# Patient Record
Sex: Male | Born: 1980 | Race: White | Hispanic: No | Marital: Single | Smoking: Current some day smoker
Health system: Southern US, Community
[De-identification: ages and names within clinical notes are randomized; demographics above are authoritative.]

## PROBLEM LIST (undated history)

## (undated) DIAGNOSIS — G35 Multiple sclerosis: Secondary | ICD-10-CM

## (undated) DIAGNOSIS — F419 Anxiety disorder, unspecified: Secondary | ICD-10-CM

## (undated) DIAGNOSIS — L0201 Cutaneous abscess of face: Secondary | ICD-10-CM

## (undated) HISTORY — PX: OTHER SURGICAL HISTORY: SHX169

## (undated) HISTORY — DX: Anxiety disorder, unspecified: F41.9

---

## 2005-03-08 ENCOUNTER — Emergency Department (HOSPITAL_COMMUNITY): Admission: EM | Admit: 2005-03-08 | Discharge: 2005-03-08 | Payer: Self-pay | Admitting: Emergency Medicine

## 2005-09-07 ENCOUNTER — Emergency Department (HOSPITAL_COMMUNITY): Admission: EM | Admit: 2005-09-07 | Discharge: 2005-09-08 | Payer: Self-pay | Admitting: *Deleted

## 2007-01-17 ENCOUNTER — Ambulatory Visit (HOSPITAL_COMMUNITY): Admission: RE | Admit: 2007-01-17 | Discharge: 2007-01-17 | Payer: Self-pay | Admitting: Internal Medicine

## 2009-11-12 ENCOUNTER — Emergency Department (HOSPITAL_COMMUNITY): Admission: EM | Admit: 2009-11-12 | Discharge: 2009-11-12 | Payer: Self-pay | Admitting: Family Medicine

## 2010-01-04 ENCOUNTER — Emergency Department (HOSPITAL_COMMUNITY): Admission: EM | Admit: 2010-01-04 | Discharge: 2010-01-04 | Payer: Self-pay | Admitting: Emergency Medicine

## 2010-01-30 ENCOUNTER — Emergency Department (HOSPITAL_COMMUNITY)
Admission: EM | Admit: 2010-01-30 | Discharge: 2010-01-30 | Payer: Self-pay | Source: Home / Self Care | Admitting: Emergency Medicine

## 2010-01-30 ENCOUNTER — Emergency Department (HOSPITAL_COMMUNITY): Admission: EM | Admit: 2010-01-30 | Discharge: 2010-01-30 | Payer: Self-pay | Admitting: Emergency Medicine

## 2010-06-11 ENCOUNTER — Emergency Department (HOSPITAL_COMMUNITY): Admission: EM | Admit: 2010-06-11 | Discharge: 2010-06-11 | Payer: Self-pay | Admitting: Emergency Medicine

## 2010-06-13 ENCOUNTER — Emergency Department (HOSPITAL_COMMUNITY): Admission: EM | Admit: 2010-06-13 | Discharge: 2010-06-14 | Payer: Self-pay | Admitting: Emergency Medicine

## 2010-09-20 ENCOUNTER — Emergency Department (HOSPITAL_COMMUNITY)
Admission: EM | Admit: 2010-09-20 | Discharge: 2010-09-20 | Payer: Self-pay | Source: Home / Self Care | Admitting: Emergency Medicine

## 2010-12-04 LAB — RAPID URINE DRUG SCREEN, HOSP PERFORMED
Amphetamines: NOT DETECTED
Barbiturates: NOT DETECTED
Opiates: NOT DETECTED
Tetrahydrocannabinol: NOT DETECTED

## 2010-12-04 LAB — DIFFERENTIAL
Eosinophils Absolute: 0.1 10*3/uL (ref 0.0–0.7)
Lymphocytes Relative: 13 % (ref 12–46)
Lymphs Abs: 1.2 10*3/uL (ref 0.7–4.0)
Monocytes Relative: 9 % (ref 3–12)
Neutro Abs: 7.1 10*3/uL (ref 1.7–7.7)
Neutrophils Relative %: 77 % (ref 43–77)

## 2010-12-04 LAB — URINALYSIS, ROUTINE W REFLEX MICROSCOPIC
Nitrite: NEGATIVE
Specific Gravity, Urine: 1.02 (ref 1.005–1.030)
Urobilinogen, UA: 0.2 mg/dL (ref 0.0–1.0)
pH: 6.5 (ref 5.0–8.0)

## 2010-12-04 LAB — ACETAMINOPHEN LEVEL: Acetaminophen (Tylenol), Serum: <10 ug/mL — ABNORMAL LOW (ref 10–30)

## 2010-12-04 LAB — BASIC METABOLIC PANEL
BUN: 13 mg/dL (ref 6–23)
Calcium: 9.2 mg/dL (ref 8.4–10.5)
Creatinine, Ser: 0.82 mg/dL (ref 0.4–1.5)
GFR calc Af Amer: >60 mL/min (ref >60)
GFR calc non Af Amer: >60 mL/min (ref >60)

## 2010-12-04 LAB — CBC
MCV: 88.4 fL (ref 78.0–100.0)
Platelets: 191 10*3/uL (ref 150–400)
RBC: 4.56 MIL/uL (ref 4.22–5.81)
WBC: 9.2 10*3/uL (ref 4.0–10.5)

## 2010-12-04 LAB — ETHANOL: Alcohol, Ethyl (B): <5 mg/dL (ref 0–10)

## 2010-12-04 LAB — SALICYLATE LEVEL: Salicylate Lvl: <4 mg/dL (ref 2.8–20.0)

## 2010-12-05 LAB — URINALYSIS, ROUTINE W REFLEX MICROSCOPIC
Ketones, ur: 15 mg/dL — AB
Leukocytes, UA: NEGATIVE
Nitrite: NEGATIVE
pH: 6.5 (ref 5.0–8.0)

## 2010-12-05 LAB — DIFFERENTIAL
Lymphs Abs: 2.6 10*3/uL (ref 0.7–4.0)
Monocytes Relative: 17 % — ABNORMAL HIGH (ref 3–12)
Neutro Abs: 3.4 10*3/uL (ref 1.7–7.7)
Neutrophils Relative %: 45 % (ref 43–77)

## 2010-12-05 LAB — CBC
HCT: 45.7 % (ref 39.0–52.0)
Hemoglobin: 15.7 g/dL (ref 13.0–17.0)
MCHC: 34.4 g/dL (ref 30.0–36.0)
Platelets: 180 10*3/uL (ref 150–400)
RDW: 13.9 % (ref 11.5–15.5)

## 2010-12-05 LAB — URINE MICROSCOPIC-ADD ON

## 2010-12-05 LAB — COMPREHENSIVE METABOLIC PANEL
BUN: 10 mg/dL (ref 6–23)
Calcium: 9.9 mg/dL (ref 8.4–10.5)
Glucose, Bld: 79 mg/dL (ref 70–99)
Sodium: 138 mEq/L (ref 135–145)
Total Protein: 8.3 g/dL (ref 6.0–8.3)

## 2011-06-25 ENCOUNTER — Emergency Department (HOSPITAL_COMMUNITY): Payer: Self-pay

## 2011-06-25 ENCOUNTER — Emergency Department (HOSPITAL_COMMUNITY)
Admission: EM | Admit: 2011-06-25 | Discharge: 2011-06-25 | Disposition: A | Discharge status: Home / Self Care | Payer: Self-pay | Attending: Emergency Medicine | Admitting: Emergency Medicine

## 2011-06-25 DIAGNOSIS — J069 Acute upper respiratory infection, unspecified: Secondary | ICD-10-CM | POA: Insufficient documentation

## 2011-06-25 DIAGNOSIS — R569 Unspecified convulsions: Secondary | ICD-10-CM | POA: Insufficient documentation

## 2011-06-25 DIAGNOSIS — J3489 Other specified disorders of nose and nasal sinuses: Secondary | ICD-10-CM | POA: Insufficient documentation

## 2011-06-25 DIAGNOSIS — G35 Multiple sclerosis: Secondary | ICD-10-CM | POA: Insufficient documentation

## 2011-06-25 DIAGNOSIS — R05 Cough: Secondary | ICD-10-CM | POA: Insufficient documentation

## 2011-06-25 DIAGNOSIS — R197 Diarrhea, unspecified: Secondary | ICD-10-CM | POA: Insufficient documentation

## 2011-06-25 DIAGNOSIS — R059 Cough, unspecified: Secondary | ICD-10-CM | POA: Insufficient documentation

## 2011-06-25 LAB — POCT I-STAT, CHEM 8
BUN: 7 mg/dL (ref 6–23)
Chloride: 102 mEq/L (ref 96–112)
Creatinine, Ser: 1 mg/dL (ref 0.50–1.35)
Glucose, Bld: 94 mg/dL (ref 70–99)
HCT: 49 % (ref 39.0–52.0)
Potassium: 3.8 mEq/L (ref 3.5–5.1)

## 2013-01-28 ENCOUNTER — Emergency Department (HOSPITAL_BASED_OUTPATIENT_CLINIC_OR_DEPARTMENT_OTHER)
Admission: EM | Admit: 2013-01-28 | Discharge: 2013-01-28 | Disposition: A | Discharge status: Home / Self Care | Payer: BC Managed Care – PPO | Attending: Emergency Medicine | Admitting: Emergency Medicine

## 2013-01-28 ENCOUNTER — Encounter (HOSPITAL_BASED_OUTPATIENT_CLINIC_OR_DEPARTMENT_OTHER): Payer: Self-pay | Admitting: *Deleted

## 2013-01-28 DIAGNOSIS — F17200 Nicotine dependence, unspecified, uncomplicated: Secondary | ICD-10-CM | POA: Insufficient documentation

## 2013-01-28 DIAGNOSIS — Z880 Allergy status to penicillin: Secondary | ICD-10-CM | POA: Insufficient documentation

## 2013-01-28 DIAGNOSIS — K029 Dental caries, unspecified: Secondary | ICD-10-CM

## 2013-01-28 DIAGNOSIS — Z8669 Personal history of other diseases of the nervous system and sense organs: Secondary | ICD-10-CM | POA: Insufficient documentation

## 2013-01-28 HISTORY — DX: Multiple sclerosis: G35

## 2013-01-28 MED ORDER — TRAMADOL HCL 50 MG PO TABS: 50.0000 mg | ORAL_TABLET | Freq: Four times a day (QID) | ORAL | Status: DC | PRN

## 2013-01-28 MED ORDER — CLINDAMYCIN HCL 300 MG PO CAPS: 300.0000 mg | ORAL_CAPSULE | Freq: Four times a day (QID) | ORAL | Status: DC

## 2013-01-28 NOTE — ED Notes (Signed)
PT C/O TOOTHACHE X 1 DAY

## 2013-01-28 NOTE — ED Provider Notes (Signed)
History     CSN: [id number]  Arrival date & time 01/28/13  1901   First MD Initiated Contact with Patient 01/28/13 1922      Chief Complaint  Patient presents with  . Dental Pain    (Consider location/radiation/quality/duration/timing/severity/associated sxs/prior treatment) HPI Comments: Patient is a one-month history of pain to his left upper molar. He says over last 2 days the pain became worse and more frequent. He describes a constant throbbing pain intensifies at times. He denies he nausea vomiting. He denies any facial swelling. He denies he fevers or chills. He currently does not have a dentist.  Patient is a 32 y.o. male presenting with tooth pain.  Dental PainPrimary symptoms do not include headaches or fever.  Additional symptoms do not include: facial swelling.    Past Medical History  Diagnosis Date  . MS (multiple sclerosis)     History reviewed. No pertinent past surgical history.  History reviewed. No pertinent family history.  History  Substance Use Topics  . Smoking status: Current Some Day Smoker  . Smokeless tobacco: Not on file  . Alcohol Use: No      Review of Systems  Constitutional: Negative for fever.  HENT: Positive for dental problem. Negative for facial swelling.   Gastrointestinal: Negative for nausea and vomiting.  Skin: Negative for rash.  Neurological: Negative for light-headedness and headaches.    Allergies  Penicillins  Home Medications   Current Outpatient Rx  Name  Route  Sig  Dispense  Refill  . clindamycin (CLEOCIN) 300 MG capsule   Oral   Take 1 capsule (300 mg total) by mouth 4 (four) times daily. X 7 days   28 capsule   0   . traMADol (ULTRAM) 50 MG tablet   Oral   Take 1 tablet (50 mg total) by mouth every 6 (six) hours as needed for pain.   15 tablet   0     BP 135/80  Pulse 67  Temp(Src) 98 F (36.7 C) (Oral)  Resp 16  Ht 6\' 1"  (1.854 m)  Wt 150 lb (68.04 kg)  BMI 19.79 kg/m2  SpO2  100%  Physical Exam  Constitutional: He is oriented to person, place, and time. He appears well-developed and well-nourished.  HENT:  Mouth/Throat: Oropharynx is clear and moist.  Positive tenderness to the left upper back molar. There is no swelling or induration. No trismus. Uvula is midline.  Cardiovascular: Normal rate.   Pulmonary/Chest: Effort normal.  Neurological: He is alert and oriented to person, place, and time.    ED Course  Procedures (including critical care time)  Labs Reviewed - No data to display No results found.   1. Dental caries       MDM  Patient was started on antibiotics and pain medicine. He was given referral to followup with the dental clinic.        Rolan Bucco, MD 01/28/13 [phone]

## 2013-01-28 NOTE — Discharge Instructions (Signed)

## 2013-10-07 ENCOUNTER — Emergency Department (HOSPITAL_COMMUNITY)
Admission: EM | Admit: 2013-10-07 | Discharge: 2013-10-07 | Disposition: A | Discharge status: Home / Self Care | Payer: BC Managed Care – PPO | Attending: Emergency Medicine | Admitting: Emergency Medicine

## 2013-10-07 ENCOUNTER — Encounter (HOSPITAL_COMMUNITY): Payer: Self-pay | Admitting: Emergency Medicine

## 2013-10-07 DIAGNOSIS — Y939 Activity, unspecified: Secondary | ICD-10-CM | POA: Insufficient documentation

## 2013-10-07 DIAGNOSIS — F17200 Nicotine dependence, unspecified, uncomplicated: Secondary | ICD-10-CM | POA: Insufficient documentation

## 2013-10-07 DIAGNOSIS — T424X4A Poisoning by benzodiazepines, undetermined, initial encounter: Secondary | ICD-10-CM | POA: Insufficient documentation

## 2013-10-07 DIAGNOSIS — Z880 Allergy status to penicillin: Secondary | ICD-10-CM | POA: Insufficient documentation

## 2013-10-07 DIAGNOSIS — Y929 Unspecified place or not applicable: Secondary | ICD-10-CM | POA: Insufficient documentation

## 2013-10-07 DIAGNOSIS — T424X1A Poisoning by benzodiazepines, accidental (unintentional), initial encounter: Secondary | ICD-10-CM | POA: Insufficient documentation

## 2013-10-07 DIAGNOSIS — Z8669 Personal history of other diseases of the nervous system and sense organs: Secondary | ICD-10-CM | POA: Insufficient documentation

## 2013-10-07 LAB — BASIC METABOLIC PANEL
BUN: 10 mg/dL (ref 6–23)
CALCIUM: 9.7 mg/dL (ref 8.4–10.5)
CHLORIDE: 104 meq/L (ref 96–112)
CO2: 26 meq/L (ref 19–32)
CREATININE: 0.93 mg/dL (ref 0.50–1.35)
GFR calc non Af Amer: >90 mL/min (ref >90)
Glucose, Bld: 80 mg/dL (ref 70–99)
Potassium: 3.8 mEq/L (ref 3.7–5.3)
SODIUM: 143 meq/L (ref 137–147)

## 2013-10-07 LAB — RAPID URINE DRUG SCREEN, HOSP PERFORMED
AMPHETAMINES: NOT DETECTED
Barbiturates: NOT DETECTED
Benzodiazepines: NOT DETECTED
COCAINE: NOT DETECTED
OPIATES: NOT DETECTED
TETRAHYDROCANNABINOL: NOT DETECTED

## 2013-10-07 LAB — ACETAMINOPHEN LEVEL

## 2013-10-07 LAB — SALICYLATE LEVEL: Salicylate Lvl: <2 mg/dL — ABNORMAL LOW (ref 2.8–20.0)

## 2013-10-07 LAB — CBC
HCT: 42.1 % (ref 39.0–52.0)
Hemoglobin: 14.4 g/dL (ref 13.0–17.0)
MCH: 30.6 pg (ref 26.0–34.0)
MCHC: 34.2 g/dL (ref 30.0–36.0)
MCV: 89.4 fL (ref 78.0–100.0)
PLATELETS: 226 10*3/uL (ref 150–400)
RBC: 4.71 MIL/uL (ref 4.22–5.81)
RDW: 13.7 % (ref 11.5–15.5)
WBC: 7.2 10*3/uL (ref 4.0–10.5)

## 2013-10-07 LAB — ETHANOL: Alcohol, Ethyl (B): <11 mg/dL (ref 0–11)

## 2013-10-07 MED ORDER — AMMONIA AROMATIC IN INHA
RESPIRATORY_TRACT | Status: AC
  Filled 2013-10-07: qty 10

## 2013-10-07 NOTE — ED Notes (Signed)
Pt states he took 4 valium for pain relief. Denies SI. Pt girlfriend states pt took 10-2mg  clonazepam-states she does not think patient was trying to harm himself. Girlfriend states patient "started acting weird around 2pm".  Pt sleepy and confused in triage.

## 2013-10-07 NOTE — ED Provider Notes (Signed)
CSN: [id number]     Arrival date & time 10/07/13  1600 History  This chart was scribed for Ward GivensIva L Anique Beckley, MD by Ardelia Memsylan Malpass, ED Scribe. This patient was seen in room [facility]14/[facility]14 and the patient's care was started at 5:00 PM.    Chief Complaint  Patient presents with  . Drug Overdose    The history is provided by a friend. The history is limited by the condition of the patient. No language interpreter was used.   LEVEL 5 CAVEAT (Pt doesn't awaken to verbal stimuli or sternal rub)  HPI Comments: Jack Hopkins is a 33 y.o. male who presents to the Emergency Department complaining of a drug overdose that occurred earlier today. Pt had admitted to taking 4 valium today for pain relief. Pt's girlfriend reported that pt took 10-2mg  clonazepam. Currently, pt doesn't awaken on verbal stimuli or sternal rub, but he is maintaining pulse ox at 99%. With hand over face testing, he hits his face  Upon recheck, additional history was gathered from pt's girlfriend who is now here. Girlfriend states that pt was noticed to be "sounding drunk" earlier today about 2:30 pm after being normal about 1 pm, and that he admitted to taking 10 of 2 mg Clonazepam. Girlfriend states that pt began wobbling later in the day, and continued "sounding drunk" and slurring his speech that was getting worse and he had her go to the bank at which point he got out of the car and he was having difficulty walking. She states he was falling asleep in the car, prompting her to bring pt to the ED. Girlfriend states that she does not believe pt had any suicidal intent today and she states that he has no history of SI. She states that pt has a history of recreational drug use, and she believes he just wanted to get high today. She states that pt used to use bath salts, but is not aware of any other drugs he is using now. Relative states that pt works full-time at [employer], and that he often drinks heavily on his days off. He is trying to quit  drinking and had not drank for a week until last night. Pt is a current some day smoker. 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   PCP none  Past Medical History  Diagnosis Date  . MS (multiple sclerosis)    History reviewed. No pertinent past surgical history. No family history on file. History  Substance Use Topics  . Smoking status: Current Some Day Smoker  . Smokeless tobacco: Not on file  . Alcohol Use: No  employed    Review of Systems  Unable to perform ROS: Mental status change   Allergies  Penicillins  Home Medications   Current Outpatient Rx  Name  Route  Sig  Dispense  Refill  . gabapentin (NEURONTIN) 800 MG tablet   Oral   Take 1 tablet by mouth 4 (four) times daily.          Triage Vitals: BP 127/66  Pulse 53  Temp(Src) 98.2 F (36.8 C) (Oral)  Resp 16  Ht 6\' 1"  (1.854 m)  Wt 150 lb (68.04 kg)  BMI 19.79 kg/m2  SpO2 96%  Vital signs normal except bradycardia   Physical Exam  Nursing note and vitals reviewed. Constitutional: He appears well-developed and well-nourished.  Non-toxic appearance. He does not appear ill. No distress.  Pt doesn't awaken on verbal  stimuli or sternal rub, but he is maintaining pulse ox at 99%. With hand over face testing, he hits his face.  HENT:  Head: Normocephalic and atraumatic.  Right Ear: External ear normal.  Left Ear: External ear normal.  Nose: Nose normal. No mucosal edema or rhinorrhea.  Mouth/Throat: Oropharynx is clear and moist and mucous membranes are normal. No dental abscesses or uvula swelling.  Eyes: Conjunctivae and EOM are normal. Pupils are equal, round, and reactive to light.  Neck: Normal range of motion and full passive range of motion without pain. Neck supple.  Cardiovascular: Normal rate, regular rhythm and normal heart sounds.  Exam reveals no gallop and no friction rub.   No murmur heard. Pulmonary/Chest: Effort  normal and breath sounds normal. No respiratory distress. He has no wheezes. He has no rhonchi. He has no rales. He exhibits no crepitus.  Abdominal: Soft. Normal appearance and bowel sounds are normal. He exhibits no distension. There is no rebound and no guarding.  Musculoskeletal: Normal range of motion. He exhibits no edema.  Neurological: He has normal strength. No cranial nerve deficit.  Skin: Skin is warm, dry and intact. No rash noted. No erythema. No pallor.  Psychiatric: His speech is normal. His mood appears not anxious.    ED Course  Procedures (including critical care time)  Medications  ammonia inhalant (not administered)   DIAGNOSTIC STUDIES: Oxygen Saturation is 96% on RA, normal by my interpretation.    COORDINATION OF CARE: 5:05 PM- Pt is un-arousable. Will obtain an EKG. Will recheck at a later time.  Poison control recommended observation for at least 6 hours. They recommend supportive care.  6:15 PM- Recheck and gathered additional history from pt's girlfriend.   7:11 PM- Upon another recheck, He is lying on his side and easily awakened, he still has some slurred speech. Pt states that he took the clonazepam today because he wanted to get sedated enough to sleep today, because he could not sleep last night. He denies having any intent of suicide or self harm.   8:32 PM- Upon another recheck, he knows the month and year, and said it is Thursday, instead of Wednesday. He knows he is in the hospital, but not that he is in [place]. He now states that he took one Clonazepam at first, and then kept re-dosing. He is not sure why he did this- but he states he had no intent of harming himself. He does indicate that he has a "full workload"- between work and school. He again denies SI or HI.  Pt ambulated by Nursing staff and he walked without assistance with HR 70 and pulse ox 95 %  Review of prior vital signs shows 1 prior ED visit in May 2014. That time his heart rate  was 67.  Labs Review Results for orders placed during the hospital encounter of 10/07/13  CBC      Result Value Range   WBC 7.2  4.0 - 10.5 K/uL   RBC 4.71  4.22 - 5.81 MIL/uL   Hemoglobin 14.4  13.0 - 17.0 g/dL   HCT 32.9  51.8 - 84.1 %   MCV 89.4  78.0 - 100.0 fL   MCH 30.6  26.0 - 34.0 pg   MCHC 34.2  30.0 - 36.0 g/dL   RDW 66.0  63.0 - 16.0 %   Platelets 226  150 - 400 K/uL  BASIC METABOLIC PANEL      Result Value Range   Sodium 143  137 -  147 mEq/L   Potassium 3.8  3.7 - 5.3 mEq/L   Chloride 104  96 - 112 mEq/L   CO2 26  19 - 32 mEq/L   Glucose, Bld 80  70 - 99 mg/dL   BUN 10  6 - 23 mg/dL   Creatinine, Ser 1.61  0.50 - 1.35 mg/dL   Calcium 9.7  8.4 - 09.6 mg/dL   GFR calc non Af Amer >90  >90 mL/min   GFR calc Af Amer >90  >90 mL/min  URINE RAPID DRUG SCREEN (HOSP PERFORMED)      Result Value Range   Opiates NONE DETECTED  NONE DETECTED   Cocaine NONE DETECTED  NONE DETECTED   Benzodiazepines NONE DETECTED  NONE DETECTED   Amphetamines NONE DETECTED  NONE DETECTED   Tetrahydrocannabinol NONE DETECTED  NONE DETECTED   Barbiturates NONE DETECTED  NONE DETECTED  SALICYLATE LEVEL      Result Value Range   Salicylate Lvl <2.0 (*) 2.8 - 20.0 mg/dL  ACETAMINOPHEN LEVEL      Result Value Range   Acetaminophen (Tylenol), Serum <15.0  10 - 30 ug/mL  ETHANOL      Result Value Range   Alcohol, Ethyl (B) <11  0 - 11 mg/dL   Laboratory interpretation all normal    Imaging Review No results found.  EKG Interpretation    Date/Time:  Wednesday October 07 2013 16:14:41 EST Ventricular Rate:  58 PR Interval:  158 QRS Duration: 98 QT Interval:  400 QTC Calculation: 392 R Axis:   79 Text Interpretation:  Sinus bradycardia Moderate voltage criteria for LVH, may be normal variant No previous ECGs available Confirmed by Insiya Oshea  MD-I, Ramiz Turpin ([id number]) on 10/07/2013 5:25:25 PM            MDM  patient presented with benzodiazepine overdose which appeared to be non-suicidal in  origin. He has a history of abusing drugs and used poor judgment and taking multiple doses of the pills tonight. He does not qualify for psychiatric admission.    1. Benzodiazepine overdose    Plan discharge   Devoria Albe, MD, FACEP   I personally performed the services described in this documentation, which was scribed in my presence. The recorded information has been reviewed and considered.  Devoria Albe, MD, Armando Gang    Ward Givens, MD 10/07/13 2200

## 2013-10-07 NOTE — ED Notes (Signed)
Phoned poison control. Recommendations include to watch for CNS, Resp depression, Bradycardia, hypotension. Treatment is supportive. Watch for a minimum of 6 hours. Recommend getting acetaminophen and salicylate levels also.

## 2013-10-07 NOTE — ED Notes (Signed)
Pt is now only responding to painful stimuli. Airway remains patent. O2 sats of 100%. EDP aware. To attempt to stimulate patient with ammonia.

## 2013-10-07 NOTE — ED Notes (Signed)
Pt is drowsy but responds appropriately to verbal stimuli at this time. VS remain stable.

## 2013-10-07 NOTE — Discharge Instructions (Signed)
You should not take multiple doses of medication at one time. This is extremely dangerous. Consider going to [facility] if you feel I can have a problem drugs or alcohol. Return to the ED if you feel it he would do something to harm yourself or others.   Accidental Overdose A drug overdose occurs when a chemical substance (drug or medication) is used in amounts large enough to overcome a person. This may result in severe illness or death. This is a type of poisoning. Accidental overdoses of medications or other substances come from a variety of reasons. When this happens accidentally, it is often because the person taking the substance does not know enough about what they have taken. Drugs which commonly cause overdose deaths are alcohol, psychotropic medications (medications which affect the mind), pain medications, illegal drugs (street drugs) such as cocaine and heroin, and multiple drugs taken at the same time. It may result from careless behavior (such as over-indulging at a party). Other causes of overdose may include multiple drug use, a lapse in memory, or drug use after a period of no drug use.  Sometimes overdosing occurs because a person cannot remember if they have taken their medication.  A common unintentional overdose in young children involves multi-vitamins containing iron. Iron is a part of the hemoglobin molecule in blood. It is used to transport oxygen to living cells. When taken in small amounts, iron allows the body to restock hemoglobin. In large amounts, it causes problems in the body. If this overdose is not treated, it can lead to death. Never take medicines that show signs of tampering or do not seem quite right. Never take medicines in the dark or in poor lighting. Read the label and check each dose of medicine before you take it. When adults are poisoned, it happens most often through carelessness or lack of information. Taking medicines in the dark or taking medicine prescribed for  someone else to treat the same type of problem is a dangerous practice. SYMPTOMS  Symptoms of overdose depend on the medication and amount taken. They can vary from over-activity with stimulant over-dosage, to sleepiness from depressants such as alcohol, narcotics and tranquilizers. Confusion, dizziness, nausea and vomiting may be present. If problems are severe enough coma and death may result. DIAGNOSIS  Diagnosis and management are generally straightforward if the drug is known. Otherwise it is more difficult. At times, certain symptoms and signs exhibited by the patient, or blood tests, can reveal the drug in question.  TREATMENT  In an emergency department, most patients can be treated with supportive measures. Antidotes may be available if there has been an overdose of opioids or benzodiazepines. A rapid improvement will often occur if this is the cause of overdose. At home or away from medical care:  There may be no immediate problems or warning signs in children.  Not everything works well in all cases of poisoning.  Take immediate action. Poisons may act quickly.  If you think someone has swallowed medicine or a household product, and the person is unconscious, having seizures (convulsions), or is not breathing, immediately call for an ambulance. PREVENTION   If you are an adult and have accidentally taken an overdose, you need to consider how this happened and what can be done to prevent it from happening again. If this was from a street drug or alcohol, determine if there is a problem that needs addressing. If you are not sure a problems exists, it is easy to talk  to a professional and ask them if they think you have a problem. It is better to handle this problem in this way before it happens again and has a much worse consequence. Document Released: 11/17/2004 Document Revised: 11/26/2011 Document Reviewed: 04/25/2009 [facility] Patient Information 2014 [place], [place].   Emergency  Department Resource Guide 1) Find a Doctor and Pay Out of Pocket Although you won't have to find out who is covered by your insurance plan, it is a good idea to ask around and get recommendations. You will then need to call the office and see if the doctor you have chosen will accept you as a new patient and what types of options they offer for patients who are self-pay. Some doctors offer discounts or will set up payment plans for their patients who do not have insurance, but you will need to ask so you aren't surprised when you get to your appointment.  2) Contact Your Local Health Department Not all health departments have doctors that can see patients for sick visits, but many do, so it is worth a call to see if yours does. If you don't know where your local health department is, you can check in your phone book. The CDC also has a tool to help you locate your state's health department, and many state websites also have listings of all of their local health departments.  3) Find a Walk-in Clinic If your illness is not likely to be very severe or complicated, you may want to try a walk in clinic. These are popping up all over the country in pharmacies, drugstores, and shopping centers. They're usually staffed by nurse practitioners or physician assistants that have been trained to treat common illnesses and complaints. They're usually fairly quick and inexpensive. However, if you have serious medical issues or chronic medical problems, these are probably not your best option.  No Primary Care Doctor: - Call Health Connect at  [phone] - they can help you locate a primary care doctor that  accepts your insurance, provides certain services, etc. - Physician Referral Service- [phone]  Chronic Pain Problems: Organization         Address  Phone   Notes  [facility]  [phone] Patients need to be referred by their primary care doctor.   Medication  Assistance: Organization         Address  Phone   Notes  [facility] Medication [facility] [address] [place] [place], [place] [address] [phone] --Must be a resident of [facility] -- Must have NO insurance coverage whatsoever (no Medicaid/ Medicare, etc.) -- The pt. MUST have a primary care doctor that directs their care regularly and follows them in the community   [facility]  [phone]   [employer]  [phone]    Agencies that provide inexpensive medical care: Organization         Address  Phone   Notes  [facility]  [phone]   [facility]    [phone]   [facility] [address] [place], [place] [address] [phone]   [facility] [address] [place], [place] [phone]   Planned Parenthood    [phone]   [facility]    [phone]   [facility]  [address], [place] Phone:  [phone], Fax:  [phone] Hours of Operation:  9 am -  6 pm, M-F.  Also accepts Medicaid/Medicare and self-pay.  [facility]  [address], [place] Phone: [phone], Fax: [phone]) [phone]. Hours of Operation:  8:30 am - 5:30 pm, M-F.  Also accepts Medicaid and self-pay.  [facility] [place] [address], [place] Phone: [phone]   [facility] [address], [place], [place] [phone], Ext. [phone] Mondays & Thursdays: 7-9 AM.  First 15 patients are seen on a first come, first serve basis.    Medicaid-accepting [facility] Providers:  Organization         Address  Phone   Notes  [facility] [address], [place] [phone] Also accepts self-pay patients.  [facility] [address], [place]  [phone]   [facility] [address], [place]  [phone]   [facility] [address] [place] [address], [place] [phone]   Renaye RakersVeita Bland [address], [place]   [phone] Only accepts [place] Access [place] patients after they have their name applied to their card.   Self-Pay (no insurance) in [facility]:  Organization         Address  Phone   Notes  Sickle Cell Patients, [facility] [address], [place] [phone]) [phone]   [facility] [address], [place] [phone]   [facility]  [address],  [phone]) [phone]   [facility]/Dr. Osei-Bonsu  [address] [place] [address], [place] or [phone], [place] [phone] Phone number for both [place] and [place] locations is the same.  [facility] [address], [place] [phone]) [phone]   [facility] [place] [address] [place] [address], [place] or [address] [phone]) [phone]) [phone]   [facility] [address], [place] [phone]) [phone], phone; [phone], fax Sees patients 1st and 3rd Saturday of every month.  Must not qualify for public or private insurance (i.e. Medicaid, Medicare, [place] Health Choice, Veterans' Benefits)  Household income should be no more than 200% of the poverty level The clinic cannot treat you if you are pregnant or think you are pregnant  Sexually transmitted diseases are not treated at the clinic.    Dental Care: Organization         Address  Phone  Notes  [facility] Department of [facility] [address], [place] [phone] Accepts children up to age 921 who are enrolled in [place] or [place] Health Choice; pregnant women with a Medicaid card; and children who have applied for Medicaid or [place] Health Choice, but were declined, whose parents can pay a reduced fee at time of service.  [facility]  Department of [facility] [place]  [address], [place] [phone] Accepts children up to age 31 who are enrolled in [place] or [place] Health Choice; pregnant women with a Medicaid card; and children who have applied for Medicaid or [place] Health Choice, but were declined, whose parents can pay a reduced fee at time of service.  [facility]  [address], [place] [phone]) [phone] Patients are seen by appointment only. Walk-ins are not accepted. [facility] will see patients 31 years of age and  older. Monday - Tuesday (8am-5pm) Most Wednesdays (8:30-5pm) $30 per visit, cash only  [facility] [employer] PROGRAM  [address], [facility] [phone] Patients are seen by appointment only. Walk-ins are not accepted. [facility] will see patients 3 years of age and older. One Wednesday Evening (Monthly: Volunteer Based).  $30 per visit, cash only  [employer]  [phone] for adults; Children under age 74, call Graduate Pediatric Dentistry at [phone]. Children aged 46-14, please call [phone] to request a pediatric application.  Dental services are provided in all areas of dental care including fillings, crowns and bridges, complete and partial dentures, implants, gum treatment, root canals, and extractions. Preventive care is also provided. Treatment is provided to both adults and children. Patients are selected via a lottery and there is often a waiting list.   [facility] [address] [place]  [phone] [email or web address]   [facility] [address] [place], [place] [phone], Ext. [phone] Second and Fourth Thursday of each month, opens at 6:30 AM; Clinic ends at 9 AM.  Patients are seen on a first-come first-served basis, and a limited number are seen during each clinic.   [facility]  [address] [place], [place] [phone]    Eligibility Requirements You must have lived in [place], [place], or [place] counties for at least the last three months.   You cannot be eligible for state or federal sponsored [place], including [employer], [place], or [employer].   You generally cannot be eligible for healthcare insurance through your employer.    How to apply: Eligibility screenings are held every Tuesday and Wednesday afternoon from 1:00 pm until 4:00 pm. You do not need an appointment for the interview!  [facility] [address], [place], [place] [phone]   [facility] Health Department  [phone]   [facility] Health Department  [phone]   [facility] Health [facility]  [phone]    Behavioral Health Resources in the Community: Intensive Outpatient Programs Organization         Address  Phone  Notes  [facility] [address] [place], [place] [phone]   [facility] Outpatient [address] [place] [address], [place], [place] [phone]   [facility] [address], [place], [place]  [phone]   [facility] [address],  [place], [place] [phone] or [phone]   Substance Abuse Resources Organization         Address  Phone  Notes  [facility] Drug [facility]  [phone]   [facility]  [phone]   [facility]  [phone]   Floydene Flock  [phone]   Residential & Outpatient Substance Abuse Program  [phone]   Psychological Services Organization         Address  Phone  Notes  [facility]  [phone]   [facility]  [phone]   [facility] Mental [facility] [address], [place] [phone] or [phone]    Mobile Crisis Teams Organization         Address  Phone  Notes  [facility], Mobile Crisis Care Unit  [phone]   [facility]  [address]  [place], [place] [phone]   [facility] [address] [place] [phone]    Self-Help/Support Groups Organization         Address  Phone             Notes  [facility] [place] - variety of support groups  [phone] Call for more information  Narcotics Anonymous (NA), [facility]aring [facility] [address], [employer] [place]  2 meetings at this location   Statistician         Address  Phone  Notes  [facility] [address],    [place]  [phone]   [facility]  [address] [place] [address], [place], [place] [phone]   [facility] [address] [place], [place] [phone] Admissions: 8am-3pm M-F  [facility] [address]    [place], [place] [phone]   [facility] [address] [place], [place], [place] [phone]   [facility] [address],  [place], [place] [phone]   [facility] - Intensive Outpatient [address], [place], [place] [phone]   [facility] ([facility] [address]  [place], [place] [phone] or [phone]   [facility] ([facility]) [address], [place], [place] [phone] Accepts Medicaid  [facility] [address],  [place] [phone] Substance Abuse/Addiction Treatment   [facility] Organization         Address  Phone  Notes  [facility]  [phone]   Angie Fava, PhD [address] [place], [place]   [phone] or [phone]   [facility]   [address] [place], [place] [phone]   [facility] [address] [place], [place] [phone] Insurance/Medicaid/sponsorship through [facility] [address]                                    [place], [place] [phone] Therapy/tele-psych/case    [facility] [address], [place] [phone]    Dr. Lolly Mustache  [phone]   [facility]  [employer] [facility] Dept. 1) [address], [place] 2) [address] [place] 3)  [address], [place] [phone]  [phone]   [facility] Child Abuse Hotline [phone] or [phone] (After Hours)

## 2013-10-07 NOTE — ED Notes (Signed)
Gave patient warm blanket. Patient drowsy at this time but did answer questions.

## 2013-10-07 NOTE — ED Notes (Signed)
Ambulated patient around nurses desk. He was stable on his feet with no dizzy spells. O2 sat was 95 and HR was 70.

## 2013-12-31 ENCOUNTER — Encounter (HOSPITAL_COMMUNITY): Payer: Self-pay | Admitting: Emergency Medicine

## 2013-12-31 ENCOUNTER — Emergency Department (HOSPITAL_COMMUNITY)
Admission: EM | Admit: 2013-12-31 | Discharge: 2013-12-31 | Disposition: A | Discharge status: Home / Self Care | Payer: BC Managed Care – PPO | Attending: Emergency Medicine | Admitting: Emergency Medicine

## 2013-12-31 DIAGNOSIS — F17200 Nicotine dependence, unspecified, uncomplicated: Secondary | ICD-10-CM | POA: Insufficient documentation

## 2013-12-31 DIAGNOSIS — Z79899 Other long term (current) drug therapy: Secondary | ICD-10-CM | POA: Insufficient documentation

## 2013-12-31 DIAGNOSIS — Z880 Allergy status to penicillin: Secondary | ICD-10-CM | POA: Insufficient documentation

## 2013-12-31 DIAGNOSIS — F1010 Alcohol abuse, uncomplicated: Secondary | ICD-10-CM | POA: Insufficient documentation

## 2013-12-31 DIAGNOSIS — Z8669 Personal history of other diseases of the nervous system and sense organs: Secondary | ICD-10-CM | POA: Insufficient documentation

## 2013-12-31 NOTE — Discharge Instructions (Signed)
As discussed, your physical exam has been reassuring, but with your recent indulgence in excessive amounts of alcohol, it is important that you monitor your condition carefully, and do not drink alcohol excessively again.  Please be sure to follow up with your physician or with a community health clinic for further assistance with alcohol abuse cessation.   Alcohol Problems Most adults who drink alcohol drink in moderation (not a lot) are at low risk for developing problems related to their drinking. However, all drinkers, including low-risk drinkers, should know about the health risks connected with drinking alcohol. RECOMMENDATIONS FOR LOW-RISK DRINKING  Drink in moderation. Moderate drinking is defined as follows:   Men - no more than 2 drinks per day.  Nonpregnant women - no more than 1 drink per day.  Over age 31 - no more than 1 drink per day. A standard drink is 12 grams of pure alcohol, which is equal to a 12 ounce bottle of beer or wine cooler, a 5 ounce glass of wine, or 1.5 ounces of distilled spirits (such as whiskey, brandy, vodka, or rum).  ABSTAIN FROM (DO NOT DRINK) ALCOHOL:  When pregnant or considering pregnancy.  When taking a medication that interacts with alcohol.  If you are alcohol dependent.  A medical condition that prohibits drinking alcohol (such as ulcer, liver disease, or heart disease). DISCUSS WITH YOUR CAREGIVER:  If you are at risk for coronary heart disease, discuss the potential benefits and risks of alcohol use: Light to moderate drinking is associated with lower rates of coronary heart disease in certain populations (for example, men over age 31 and postmenopausal women). Infrequent or nondrinkers are advised not to begin light to moderate drinking to reduce the risk of coronary heart disease so as to avoid creating an alcohol-related problem. Similar protective effects can likely be gained through proper diet and exercise.  Women and the elderly have  smaller amounts of body water than men. As a result women and the elderly achieve a higher blood alcohol concentration after drinking the same amount of alcohol.  Exposing a fetus to alcohol can cause a broad range of birth defects referred to as Fetal Alcohol Syndrome (FAS) or Alcohol-Related Birth Defects (ARBD). Although FAS/ARBD is connected with excessive alcohol consumption during pregnancy, studies also have reported neurobehavioral problems in infants born to mothers reporting drinking an average of 1 drink per day during pregnancy.  Heavier drinking (the consumption of more than 4 drinks per occasion by men and more than 3 drinks per occasion by women) impairs learning (cognitive) and psychomotor functions and increases the risk of alcohol-related problems, including accidents and injuries. CAGE QUESTIONS:   Have you ever felt that you should Cut down on your drinking?  Have people Annoyed you by criticizing your drinking?  Have you ever felt bad or Guilty about your drinking?  Have you ever had a drink first thing in the morning to steady your nerves or get rid of a hangover (Eye opener)? If you answered positively to any of these questions: You may be at risk for alcohol-related problems if alcohol consumption is:   Men: Greater than 14 drinks per week or more than 4 drinks per occasion.  Women: Greater than 7 drinks per week or more than 3 drinks per occasion. Do you or your family have a medical history of alcohol-related problems, such as:  Blackouts.  Sexual dysfunction.  Depression.  Trauma.  Liver dysfunction.  Sleep disorders.  Hypertension.  Chronic abdominal pain.  Has your drinking ever caused you problems, such as problems with your family, problems with your work (or school) performance, or accidents/injuries?  Do you have a compulsion to drink or a preoccupation with drinking?  Do you have poor control or are you unable to stop drinking once you have  started?  Do you have to drink to avoid withdrawal symptoms?  Do you have problems with withdrawal such as tremors, nausea, sweats, or mood disturbances?  Does it take more alcohol than in the past to get you high?  Do you feel a strong urge to drink?  Do you change your plans so that you can have a drink?  Do you ever drink in the morning to relieve the shakes or a hangover? If you have answered a number of the previous questions positively, it may be time for you to talk to your caregivers, family, and friends and see if they think you have a problem. Alcoholism is a chemical dependency that keeps getting worse and will eventually destroy your health and relationships. Many alcoholics end up dead, impoverished, or in prison. This is often the end result of all chemical dependency.  Do not be discouraged if you are not ready to take action immediately.  Decisions to change behavior often involve up and down desires to change and feeling like you cannot decide.  Try to think more seriously about your drinking behavior.  Think of the reasons to quit. WHERE TO GO FOR ADDITIONAL INFORMATION   The National Institute on Alcohol Abuse and Alcoholism (NIAAA) BasicStudents[employer]dk  [employer] on Alcoholism and Drug Dependence (NCADD) www.ncadd.org  American Society of Addiction Medicine (ASAM) RoyalDiary.gl  Document Released: 09/03/2005 Document Revised: 11/26/2011 Document Reviewed: 04/21/2008 [facility] Patient Information 2014 [place], [place].
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
## 2014-02-12 ENCOUNTER — Encounter (HOSPITAL_COMMUNITY): Payer: Self-pay | Admitting: Emergency Medicine

## 2014-02-12 ENCOUNTER — Emergency Department (HOSPITAL_COMMUNITY)
Admission: EM | Admit: 2014-02-12 | Discharge: 2014-02-12 | Disposition: A | Discharge status: Home / Self Care | Payer: BC Managed Care – PPO | Attending: Emergency Medicine | Admitting: Emergency Medicine

## 2014-02-12 ENCOUNTER — Emergency Department (HOSPITAL_COMMUNITY): Payer: BC Managed Care – PPO

## 2014-02-12 DIAGNOSIS — S99919A Unspecified injury of unspecified ankle, initial encounter: Principal | ICD-10-CM

## 2014-02-12 DIAGNOSIS — Z79899 Other long term (current) drug therapy: Secondary | ICD-10-CM | POA: Insufficient documentation

## 2014-02-12 DIAGNOSIS — F17200 Nicotine dependence, unspecified, uncomplicated: Secondary | ICD-10-CM | POA: Insufficient documentation

## 2014-02-12 DIAGNOSIS — Y9389 Activity, other specified: Secondary | ICD-10-CM | POA: Insufficient documentation

## 2014-02-12 DIAGNOSIS — Z880 Allergy status to penicillin: Secondary | ICD-10-CM | POA: Insufficient documentation

## 2014-02-12 DIAGNOSIS — Z791 Long term (current) use of non-steroidal anti-inflammatories (NSAID): Secondary | ICD-10-CM | POA: Insufficient documentation

## 2014-02-12 DIAGNOSIS — Y92410 Unspecified street and highway as the place of occurrence of the external cause: Secondary | ICD-10-CM | POA: Insufficient documentation

## 2014-02-12 DIAGNOSIS — S8990XA Unspecified injury of unspecified lower leg, initial encounter: Secondary | ICD-10-CM | POA: Insufficient documentation

## 2014-02-12 DIAGNOSIS — M25562 Pain in left knee: Secondary | ICD-10-CM

## 2014-02-12 DIAGNOSIS — Z8669 Personal history of other diseases of the nervous system and sense organs: Secondary | ICD-10-CM | POA: Insufficient documentation

## 2014-02-12 DIAGNOSIS — S99929A Unspecified injury of unspecified foot, initial encounter: Principal | ICD-10-CM

## 2014-02-12 MED ORDER — HYDROCODONE-ACETAMINOPHEN 5-325 MG PO TABS
ORAL_TABLET | ORAL | Status: DC
Start: 2014-02-12 — End: 2014-12-22

## 2014-02-12 MED ORDER — NAPROXEN 500 MG PO TABS: 500.0000 mg | ORAL_TABLET | Freq: Two times a day (BID) | ORAL | Status: DC

## 2014-02-12 NOTE — Discharge Instructions (Signed)
Knee Pain Knee pain can be a result of an injury or other medical conditions. Treatment will depend on the cause of your pain. HOME CARE  Only take medicine as told by your doctor.  Keep a healthy weight. Being overweight can make the knee hurt more.  Stretch before exercising or playing sports.  If there is constant knee pain, change the way you exercise. Ask your doctor for advice.  Make sure shoes fit well. Choose the right shoe for the sport or activity.  Protect your knees. Wear kneepads if needed.  Rest when you are tired. GET HELP RIGHT AWAY IF:   Your knee pain does not stop.  Your knee pain does not get better.  Your knee joint feels hot to the touch.  You have a fever. MAKE SURE YOU:   Understand these instructions.  Will watch this condition.  Will get help right away if you are not doing well or get worse. Document Released: 11/30/2008 Document Revised: 11/26/2011 Document Reviewed: 11/30/2008 [employer] Patient Information 2014 [employer].  
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
## 2014-02-15 NOTE — ED Provider Notes (Signed)
Medical screening examination/treatment/procedure(s) were performed by non-physician practitioner and as supervising physician I was immediately available for consultation/collaboration.   EKG Interpretation None      Devoria Albe, MD, Armando Gang   Ward Givens, MD 02/15/14 1452

## 2014-03-16 ENCOUNTER — Ambulatory Visit: Payer: BC Managed Care – PPO | Admitting: Orthopedic Surgery

## 2014-03-16 ENCOUNTER — Encounter: Payer: Self-pay | Admitting: Orthopedic Surgery

## 2014-06-04 ENCOUNTER — Encounter: Payer: Self-pay | Admitting: *Deleted

## 2014-06-09 ENCOUNTER — Ambulatory Visit: Payer: BC Managed Care – PPO | Admitting: Neurology

## 2014-06-17 ENCOUNTER — Encounter (HOSPITAL_BASED_OUTPATIENT_CLINIC_OR_DEPARTMENT_OTHER): Payer: Self-pay | Admitting: Emergency Medicine

## 2014-06-17 ENCOUNTER — Emergency Department (HOSPITAL_BASED_OUTPATIENT_CLINIC_OR_DEPARTMENT_OTHER)
Admission: EM | Admit: 2014-06-17 | Discharge: 2014-06-17 | Disposition: A | Discharge status: Home / Self Care | Payer: BC Managed Care – PPO | Attending: Emergency Medicine | Admitting: Emergency Medicine

## 2014-06-17 ENCOUNTER — Emergency Department (HOSPITAL_BASED_OUTPATIENT_CLINIC_OR_DEPARTMENT_OTHER): Payer: BC Managed Care – PPO

## 2014-06-17 DIAGNOSIS — Z880 Allergy status to penicillin: Secondary | ICD-10-CM | POA: Insufficient documentation

## 2014-06-17 DIAGNOSIS — Y9289 Other specified places as the place of occurrence of the external cause: Secondary | ICD-10-CM | POA: Diagnosis not present

## 2014-06-17 DIAGNOSIS — S40012A Contusion of left shoulder, initial encounter: Secondary | ICD-10-CM

## 2014-06-17 DIAGNOSIS — Y9389 Activity, other specified: Secondary | ICD-10-CM | POA: Insufficient documentation

## 2014-06-17 DIAGNOSIS — Z791 Long term (current) use of non-steroidal anti-inflammatories (NSAID): Secondary | ICD-10-CM | POA: Insufficient documentation

## 2014-06-17 DIAGNOSIS — Z79899 Other long term (current) drug therapy: Secondary | ICD-10-CM | POA: Insufficient documentation

## 2014-06-17 DIAGNOSIS — S4992XA Unspecified injury of left shoulder and upper arm, initial encounter: Secondary | ICD-10-CM | POA: Diagnosis present

## 2014-06-17 DIAGNOSIS — W1849XA Other slipping, tripping and stumbling without falling, initial encounter: Secondary | ICD-10-CM | POA: Insufficient documentation

## 2014-06-17 DIAGNOSIS — Z720 Tobacco use: Secondary | ICD-10-CM | POA: Diagnosis not present

## 2014-06-17 DIAGNOSIS — F419 Anxiety disorder, unspecified: Secondary | ICD-10-CM | POA: Diagnosis not present

## 2014-06-17 MED ORDER — HYDROCODONE-ACETAMINOPHEN 5-325 MG PO TABS
2.0000 | ORAL_TABLET | ORAL | Status: DC | PRN
Start: 2014-06-17 — End: 2014-12-22

## 2014-06-17 NOTE — ED Provider Notes (Signed)
CSN: [id number]     Arrival date & time 06/17/14  1105 History   First MD Initiated Contact with Patient 06/17/14 1211     Chief Complaint  Patient presents with  . Arm Pain     (Consider location/radiation/quality/duration/timing/severity/associated sxs/prior Treatment) Patient is a 33 y.o. male presenting with arm pain. The history is provided by the patient. No language interpreter was used.  Arm Pain This is a new problem. The current episode started today. The problem occurs constantly. The problem has been gradually worsening. Associated symptoms include arthralgias. Nothing aggravates the symptoms. He has tried nothing for the symptoms. The treatment provided moderate relief.  Pt complains of pain in his left shoulder after falling on wet step on Sunday.    Past Medical History  Diagnosis Date  . MS (multiple sclerosis)   . Anxiety    History reviewed. No pertinent past surgical history. Family History  Problem Relation Age of Onset  . Cancer Mother    History  Substance Use Topics  . Smoking status: Current Some Day Smoker  . Smokeless tobacco: Never Used  . Alcohol Use: Yes     Comment: 1-2 drinks per month    Review of Systems  Musculoskeletal: Positive for arthralgias.  All other systems reviewed and are negative.     Allergies  Penicillins  Home Medications   Prior to Admission medications   Medication Sig Start Date End Date Taking? Authorizing Provider  gabapentin (NEURONTIN) 800 MG tablet Take 1 tablet by mouth 4 (four) times daily. 09/29/13   Historical Provider, MD  HYDROcodone-acetaminophen (NORCO/VICODIN) 5-325 MG per tablet Take one-two tabs po q 4-6 hrs prn pain 02/12/14   Tammy L. Triplett, PA-C  naproxen (NAPROSYN) 500 MG tablet Take 1 tablet (500 mg total) by mouth 2 (two) times daily. 02/12/14   Tammy L. Triplett, PA-C   There were no vitals taken for this visit. Physical Exam  Nursing note and vitals reviewed. Constitutional: He is oriented  to person, place, and time. He appears well-developed and well-nourished.  HENT:  Head: Normocephalic.  Eyes: EOM are normal.  Neck: Normal range of motion.  Pulmonary/Chest: Effort normal.  Abdominal: He exhibits no distension.  Musculoskeletal: Normal range of motion.  Bruised left shoulder,  Abrasion shoulder.   From  nv and ns intact  Neurological: He is alert and oriented to person, place, and time.  Psychiatric: He has a normal mood and affect.    ED Course  Procedures (including critical care time) Labs Review Labs Reviewed - No data to display  Imaging Review Dg Shoulder Left  06/17/2014   CLINICAL DATA:  Fall.  EXAM: LEFT SHOULDER - 2+ VIEW  COMPARISON:  None.  FINDINGS: There is no evidence of fracture or dislocation. There is no evidence of arthropathy or other focal bone abnormality. Soft tissues are unremarkable.  IMPRESSION: No acute abnormality.   Electronically Signed   By: Maisie Fus  Register   On: 06/17/2014 12:45     EKG Interpretation None      MDM   Final diagnoses:  Contusion of left shoulder, initial encounter    Follow up with Dr. Pearletha Forge  hydrocodone    Elson Areas, PA-C 06/17/14 1307

## 2014-06-17 NOTE — ED Notes (Signed)
Pt reports slip and fall on wet step on Sunday, onto his left arm. Pt states he has been resting, but has ongoing pain. Denies any other c/o.

## 2014-06-17 NOTE — ED Provider Notes (Signed)
Medical screening examination/treatment/procedure(s) were performed by non-physician practitioner and as supervising physician I was immediately available for consultation/collaboration.  Toy Cookey, MD 06/17/14 [phone]

## 2014-06-17 NOTE — Discharge Instructions (Signed)
Contusion °A contusion is a deep bruise. Contusions happen when an injury causes bleeding under the skin. Signs of bruising include pain, puffiness (swelling), and discolored skin. The contusion may turn blue, purple, or yellow. °HOME CARE  °· Put ice on the injured area. °¨ Put ice in a plastic bag. °¨ Place a towel between your skin and the bag. °¨ Leave the ice on for 15-20 minutes, 03-04 times a day. °· Only take medicine as told by your doctor. °· Rest the injured area. °· If possible, raise (elevate) the injured area to lessen puffiness. °GET HELP RIGHT AWAY IF:  °· You have more bruising or puffiness. °· You have pain that is getting worse. °· Your puffiness or pain is not helped by medicine. °MAKE SURE YOU:  °· Understand these instructions. °· Will watch your condition. °· Will get help right away if you are not doing well or get worse. °Document Released: 02/20/2008 Document Revised: 11/26/2011 Document Reviewed: 07/09/2011 °[employer]® Patient Information ©2015 [employer]. This information is not intended to replace advice given to you by your health care provider. Make sure you discuss any questions you have with your health care provider. ° °

## 2014-12-17 DIAGNOSIS — L0201 Cutaneous abscess of face: Secondary | ICD-10-CM

## 2014-12-17 HISTORY — DX: Cutaneous abscess of face: L02.01

## 2014-12-20 ENCOUNTER — Encounter (HOSPITAL_COMMUNITY): Payer: Self-pay | Admitting: *Deleted

## 2014-12-20 ENCOUNTER — Emergency Department (HOSPITAL_COMMUNITY)
Admission: EM | Admit: 2014-12-20 | Discharge: 2014-12-21 | Disposition: A | Discharge status: Home / Self Care | Payer: Self-pay | Attending: Emergency Medicine | Admitting: Emergency Medicine

## 2014-12-20 DIAGNOSIS — K029 Dental caries, unspecified: Secondary | ICD-10-CM | POA: Insufficient documentation

## 2014-12-20 DIAGNOSIS — K0889 Other specified disorders of teeth and supporting structures: Secondary | ICD-10-CM

## 2014-12-20 DIAGNOSIS — Z8669 Personal history of other diseases of the nervous system and sense organs: Secondary | ICD-10-CM | POA: Insufficient documentation

## 2014-12-20 DIAGNOSIS — Z79899 Other long term (current) drug therapy: Secondary | ICD-10-CM | POA: Insufficient documentation

## 2014-12-20 DIAGNOSIS — K088 Other specified disorders of teeth and supporting structures: Secondary | ICD-10-CM | POA: Insufficient documentation

## 2014-12-20 DIAGNOSIS — Z880 Allergy status to penicillin: Secondary | ICD-10-CM | POA: Insufficient documentation

## 2014-12-20 DIAGNOSIS — Z791 Long term (current) use of non-steroidal anti-inflammatories (NSAID): Secondary | ICD-10-CM | POA: Insufficient documentation

## 2014-12-20 DIAGNOSIS — Z720 Tobacco use: Secondary | ICD-10-CM | POA: Insufficient documentation

## 2014-12-20 MED ORDER — ACETAMINOPHEN 325 MG PO TABS
650.0000 mg | ORAL_TABLET | Freq: Once | ORAL | Status: AC
  Administered 2014-12-21: 650 mg via ORAL
  Filled 2014-12-20: qty 2

## 2014-12-20 MED ORDER — KETOROLAC TROMETHAMINE 10 MG PO TABS
10.0000 mg | ORAL_TABLET | Freq: Once | ORAL | Status: AC
  Administered 2014-12-21: 10 mg via ORAL
  Filled 2014-12-20: qty 1

## 2014-12-20 MED ORDER — CLINDAMYCIN HCL 150 MG PO CAPS
300.0000 mg | ORAL_CAPSULE | Freq: Once | ORAL | Status: AC
  Administered 2014-12-21: 300 mg via ORAL
  Filled 2014-12-20: qty 2

## 2014-12-20 NOTE — ED Notes (Signed)
Pt reporting dental pain x3 days.  Reports pain on left side, and believes he has an abscess.

## 2014-12-20 NOTE — ED Provider Notes (Signed)
CSN: [id number]     Arrival date & time 12/20/14  2120 History   First MD Initiated Contact with Patient 12/20/14 2334     Chief Complaint  Patient presents with  . Dental Pain     (Consider location/radiation/quality/duration/timing/severity/associated sxs/prior Treatment) Patient is a 34 y.o. male presenting with tooth pain. The history is provided by the patient.  Dental Pain Location:  Lower Quality:  Aching Severity:  Moderate Duration:  3 days Timing:  Intermittent Progression:  Worsening Relieved by:  Nothing Worsened by:  Nothing tried Ineffective treatments:  NSAIDs Associated symptoms: no drooling, no neck pain and no trismus   Risk factors: no diabetes and no immunosuppression     Past Medical History  Diagnosis Date  . MS (multiple sclerosis)   . Anxiety    History reviewed. No pertinent past surgical history. Family History  Problem Relation Age of Onset  . Cancer Mother    History  Substance Use Topics  . Smoking status: Current Some Day Smoker  . Smokeless tobacco: Never Used  . Alcohol Use: Yes     Comment: 1-2 drinks per month    Review of Systems  Constitutional: Negative for activity change.       All ROS Neg except as noted in HPI  HENT: Positive for dental problem. Negative for drooling and nosebleeds.   Eyes: Negative for photophobia and discharge.  Respiratory: Negative for cough, shortness of breath and wheezing.   Cardiovascular: Negative for chest pain and palpitations.  Gastrointestinal: Negative for abdominal pain and blood in stool.  Genitourinary: Negative for dysuria, frequency and hematuria.  Musculoskeletal: Negative for back pain, arthralgias and neck pain.  Skin: Negative.   Neurological: Negative for dizziness, seizures and speech difficulty.  Psychiatric/Behavioral: Negative for hallucinations and confusion. The patient is nervous/anxious.       Allergies  Penicillins  Home Medications   Prior to Admission  medications   Medication Sig Start Date End Date Taking? Authorizing Provider  gabapentin (NEURONTIN) 800 MG tablet Take 1 tablet by mouth 4 (four) times daily. 09/29/13   Historical Provider, MD  HYDROcodone-acetaminophen (NORCO/VICODIN) 5-325 MG per tablet Take one-two tabs po q 4-6 hrs prn pain 02/12/14   Tammi Triplett, PA-C  HYDROcodone-acetaminophen (NORCO/VICODIN) 5-325 MG per tablet Take 2 tablets by mouth every 4 (four) hours as needed. 06/17/14   Elson Areas, PA-C  naproxen (NAPROSYN) 500 MG tablet Take 1 tablet (500 mg total) by mouth 2 (two) times daily. 02/12/14   Tammi Triplett, PA-C   BP 124/75 mmHg  Pulse 84  Temp(Src) 98.2 F (36.8 C) (Oral)  Resp 20  Ht  (1.854 m)  Wt 150 lb (68.04 kg)  BMI 19.79 kg/m2  SpO2 100% Physical Exam  Constitutional: He is oriented to person, place, and time. He appears well-developed and well-nourished.  Non-toxic appearance.  HENT:  Head: Normocephalic.  Right Ear: Tympanic membrane and external ear normal.  Left Ear: Tympanic membrane and external ear normal.  Mouth/Throat:    Swelling of the left lower gum. Multiple dental caries. No swelling under the tongue.  Eyes: EOM and lids are normal. Pupils are equal, round, and reactive to light.  Neck: Normal range of motion. Neck supple. Carotid bruit is not present.  Cardiovascular: Normal rate, regular rhythm, normal heart sounds, intact distal pulses and normal pulses.   Pulmonary/Chest: Breath sounds normal. No respiratory distress.  Abdominal: Soft. Bowel sounds are normal. There is no tenderness. There is no guarding.  Musculoskeletal:  Normal range of motion.  Lymphadenopathy:       Head (right side): No submandibular adenopathy present.       Head (left side): No submandibular adenopathy present.    He has no cervical adenopathy.  Neurological: He is alert and oriented to person, place, and time. He has normal strength. No cranial nerve deficit or sensory deficit.  Skin: Skin  is warm and dry.  Psychiatric: He has a normal mood and affect. His speech is normal.  Nursing note and vitals reviewed.   ED Course  Procedures (including critical care time) Labs Review Labs Reviewed - No data to display  Imaging Review No results found.   EKG Interpretation None      MDM Vital signs stable. Dental caries with swelling of the left lower jaw. Will treat with clindamycin and ibuprofen. Pt given dental resources.   Final diagnoses:  None    **I have reviewed nursing notes, vital signs, and all appropriate lab and imaging results for this patient.Ivery Quale, PA-C 12/20/14 2357  Geoffery Lyons, MD 12/21/14 [phone]

## 2014-12-21 MED ORDER — CLINDAMYCIN HCL 150 MG PO CAPS: ORAL_CAPSULE | ORAL | Status: DC

## 2014-12-21 MED ORDER — IBUPROFEN 800 MG PO TABS: 800.0000 mg | ORAL_TABLET | Freq: Three times a day (TID) | ORAL | Status: DC

## 2014-12-21 NOTE — Discharge Instructions (Signed)
It is important that you see a dentist as soon as possible. Use clindamycin and ibuprofen and tylenol at breakfast, lunch, dinner, and at bedtime. Dental Pain Toothache is pain in or around a tooth. It may get worse with chewing or with cold or heat.  HOME CARE  Your dentist may use a numbing medicine during treatment. If so, you may need to avoid eating until the medicine wears off. Ask your dentist about this.  Only take medicine as told by your dentist or doctor.  Avoid chewing food near the painful tooth until after all treatment is done. Ask your dentist about this. GET HELP RIGHT AWAY IF:   The problem gets worse or new problems appear.  You have a fever.  There is redness and puffiness (swelling) of the face, jaw, or neck.  You cannot open your mouth.  There is pain in the jaw.  There is very bad pain that is not helped by medicine. MAKE SURE YOU:   Understand these instructions.  Will watch your condition.  Will get help right away if you are not doing well or get worse. Document Released: 02/20/2008 Document Revised: 11/26/2011 Document Reviewed: 02/20/2008 [facility] Patient Information 2015 [place], [place]. This information is not intended to replace advice given to you by your health care provider. Make sure you discuss any questions you have with your health care provider.

## 2014-12-21 NOTE — ED Notes (Signed)
Discharge instructions and prescriptions given and reviewed with patient.  Patient verbalized understanding to take all antibiotic and to follow up with dentist as soon as possible.

## 2014-12-22 ENCOUNTER — Emergency Department (HOSPITAL_BASED_OUTPATIENT_CLINIC_OR_DEPARTMENT_OTHER): Payer: BLUE CROSS/BLUE SHIELD

## 2014-12-22 ENCOUNTER — Encounter (HOSPITAL_BASED_OUTPATIENT_CLINIC_OR_DEPARTMENT_OTHER): Payer: Self-pay | Admitting: Emergency Medicine

## 2014-12-22 ENCOUNTER — Inpatient Hospital Stay (HOSPITAL_BASED_OUTPATIENT_CLINIC_OR_DEPARTMENT_OTHER)
Admission: EM | Admit: 2014-12-22 | Discharge: 2014-12-24 | DRG: 158 | Disposition: A | Discharge status: Home / Self Care | Payer: BLUE CROSS/BLUE SHIELD | Attending: Internal Medicine | Admitting: Internal Medicine

## 2014-12-22 DIAGNOSIS — Z7982 Long term (current) use of aspirin: Secondary | ICD-10-CM | POA: Diagnosis not present

## 2014-12-22 DIAGNOSIS — F411 Generalized anxiety disorder: Secondary | ICD-10-CM

## 2014-12-22 DIAGNOSIS — D649 Anemia, unspecified: Secondary | ICD-10-CM | POA: Diagnosis present

## 2014-12-22 DIAGNOSIS — F17210 Nicotine dependence, cigarettes, uncomplicated: Secondary | ICD-10-CM | POA: Diagnosis present

## 2014-12-22 DIAGNOSIS — D72829 Elevated white blood cell count, unspecified: Secondary | ICD-10-CM

## 2014-12-22 DIAGNOSIS — L0201 Cutaneous abscess of face: Secondary | ICD-10-CM

## 2014-12-22 DIAGNOSIS — R221 Localized swelling, mass and lump, neck: Secondary | ICD-10-CM

## 2014-12-22 DIAGNOSIS — G35 Multiple sclerosis: Secondary | ICD-10-CM | POA: Diagnosis present

## 2014-12-22 DIAGNOSIS — Z880 Allergy status to penicillin: Secondary | ICD-10-CM

## 2014-12-22 DIAGNOSIS — K047 Periapical abscess without sinus: Secondary | ICD-10-CM | POA: Diagnosis present

## 2014-12-22 DIAGNOSIS — R911 Solitary pulmonary nodule: Secondary | ICD-10-CM

## 2014-12-22 DIAGNOSIS — R609 Edema, unspecified: Secondary | ICD-10-CM | POA: Diagnosis present

## 2014-12-22 DIAGNOSIS — G35D Multiple sclerosis, unspecified: Secondary | ICD-10-CM

## 2014-12-22 HISTORY — DX: Cutaneous abscess of face: L02.01

## 2014-12-22 LAB — BASIC METABOLIC PANEL
Anion gap: 8 (ref 5–15)
BUN: 12 mg/dL (ref 6–23)
CO2: 25 mmol/L (ref 19–32)
Calcium: 9 mg/dL (ref 8.4–10.5)
Chloride: 104 mmol/L (ref 96–112)
Creatinine, Ser: 1.02 mg/dL (ref 0.50–1.35)
GFR calc Af Amer: >90 mL/min (ref >90)
GFR calc non Af Amer: >90 mL/min (ref >90)
GLUCOSE: 109 mg/dL — AB (ref 70–99)
Potassium: 3.9 mmol/L (ref 3.5–5.1)
SODIUM: 137 mmol/L (ref 135–145)

## 2014-12-22 LAB — CBC WITH DIFFERENTIAL/PLATELET
BASOS ABS: 0 10*3/uL (ref 0.0–0.1)
Basophils Relative: 0 % (ref 0–1)
EOS PCT: 1 % (ref 0–5)
Eosinophils Absolute: 0.2 10*3/uL (ref 0.0–0.7)
HCT: 39.7 % (ref 39.0–52.0)
Hemoglobin: 13.5 g/dL (ref 13.0–17.0)
LYMPHS ABS: 1 10*3/uL (ref 0.7–4.0)
LYMPHS PCT: 7 % — AB (ref 12–46)
MCH: 29.9 pg (ref 26.0–34.0)
MCHC: 34 g/dL (ref 30.0–36.0)
MCV: 88 fL (ref 78.0–100.0)
Monocytes Absolute: 1.6 10*3/uL — ABNORMAL HIGH (ref 0.1–1.0)
Monocytes Relative: 11 % (ref 3–12)
NEUTROS PCT: 81 % — AB (ref 43–77)
Neutro Abs: 11.6 10*3/uL — ABNORMAL HIGH (ref 1.7–7.7)
PLATELETS: 208 10*3/uL (ref 150–400)
RBC: 4.51 MIL/uL (ref 4.22–5.81)
RDW: 13.1 % (ref 11.5–15.5)
WBC: 14.3 10*3/uL — ABNORMAL HIGH (ref 4.0–10.5)

## 2014-12-22 MED ORDER — DIAZEPAM 5 MG PO TABS: 10.0000 mg | ORAL_TABLET | Freq: Every day | ORAL | Status: DC | PRN

## 2014-12-22 MED ORDER — GABAPENTIN 400 MG PO CAPS
800.0000 mg | ORAL_CAPSULE | Freq: Four times a day (QID) | ORAL | Status: DC
  Administered 2014-12-22 – 2014-12-24 (×6): 800 mg via ORAL
  Filled 2014-12-22 (×6): qty 2

## 2014-12-22 MED ORDER — IBUPROFEN 800 MG PO TABS
800.0000 mg | ORAL_TABLET | Freq: Three times a day (TID) | ORAL | Status: DC
  Administered 2014-12-22 – 2014-12-24 (×5): 800 mg via ORAL
  Filled 2014-12-22 (×5): qty 1

## 2014-12-22 MED ORDER — MORPHINE SULFATE 4 MG/ML IJ SOLN
4.0000 mg | Freq: Once | INTRAMUSCULAR | Status: AC
  Administered 2014-12-22: 4 mg via INTRAVENOUS
  Filled 2014-12-22: qty 1

## 2014-12-22 MED ORDER — OXYCODONE HCL 5 MG PO TABS: 5.0000 mg | ORAL_TABLET | ORAL | Status: DC | PRN

## 2014-12-22 MED ORDER — IBUPROFEN 800 MG PO TABS: 800.0000 mg | ORAL_TABLET | Freq: Four times a day (QID) | ORAL | Status: DC | PRN

## 2014-12-22 MED ORDER — ONDANSETRON HCL 4 MG/2ML IJ SOLN
4.0000 mg | Freq: Once | INTRAMUSCULAR | Status: AC
  Administered 2014-12-22: 4 mg via INTRAVENOUS
  Filled 2014-12-22: qty 2

## 2014-12-22 MED ORDER — CLINDAMYCIN PHOSPHATE 600 MG/50ML IV SOLN
600.0000 mg | Freq: Once | INTRAVENOUS | Status: AC
  Administered 2014-12-22: 600 mg via INTRAVENOUS
  Filled 2014-12-22: qty 50

## 2014-12-22 MED ORDER — SODIUM CHLORIDE 0.9 % IV SOLN
INTRAVENOUS | Status: DC
  Administered 2014-12-22: 11:00:00 via INTRAVENOUS

## 2014-12-22 MED ORDER — ACETAMINOPHEN 325 MG PO TABS: 650.0000 mg | ORAL_TABLET | Freq: Four times a day (QID) | ORAL | Status: DC | PRN

## 2014-12-22 MED ORDER — KCL IN DEXTROSE-NACL 20-5-0.45 MEQ/L-%-% IV SOLN
INTRAVENOUS | Status: DC
  Administered 2014-12-23: 01:00:00 via INTRAVENOUS
  Filled 2014-12-22 (×2): qty 1000

## 2014-12-22 MED ORDER — ACETAMINOPHEN 650 MG RE SUPP: 650.0000 mg | Freq: Four times a day (QID) | RECTAL | Status: DC | PRN

## 2014-12-22 MED ORDER — OXYCODONE HCL 5 MG PO TABS
5.0000 mg | ORAL_TABLET | ORAL | Status: DC | PRN
  Administered 2014-12-22 – 2014-12-23 (×2): 5 mg via ORAL
  Filled 2014-12-22 (×2): qty 1

## 2014-12-22 MED ORDER — POLYETHYLENE GLYCOL 3350 17 G PO PACK: 17.0000 g | PACK | Freq: Every day | ORAL | Status: DC | PRN

## 2014-12-22 MED ORDER — IOHEXOL 300 MG/ML  SOLN
80.0000 mL | Freq: Once | INTRAMUSCULAR | Status: AC | PRN
  Administered 2014-12-22: 80 mL via INTRAVENOUS

## 2014-12-22 MED ORDER — ONDANSETRON HCL 4 MG/2ML IJ SOLN
4.0000 mg | Freq: Three times a day (TID) | INTRAMUSCULAR | Status: AC | PRN
  Administered 2014-12-22: 4 mg via INTRAVENOUS
  Filled 2014-12-22 (×2): qty 2

## 2014-12-22 MED ORDER — GABAPENTIN 800 MG PO TABS
800.0000 mg | ORAL_TABLET | Freq: Four times a day (QID) | ORAL | Status: DC
  Filled 2014-12-22: qty 1

## 2014-12-22 MED ORDER — CHLORHEXIDINE GLUCONATE 0.12 % MT SOLN
15.0000 mL | Freq: Two times a day (BID) | OROMUCOSAL | Status: DC
  Administered 2014-12-22 – 2014-12-24 (×4): 15 mL via OROMUCOSAL
  Filled 2014-12-22 (×4): qty 15

## 2014-12-22 MED ORDER — MORPHINE SULFATE 4 MG/ML IJ SOLN
4.0000 mg | INTRAMUSCULAR | Status: DC | PRN
  Administered 2014-12-22 (×3): 4 mg via INTRAVENOUS
  Filled 2014-12-22 (×4): qty 1

## 2014-12-22 MED ORDER — BISACODYL 5 MG PO TBEC: 5.0000 mg | DELAYED_RELEASE_TABLET | Freq: Every day | ORAL | Status: DC | PRN

## 2014-12-22 MED ORDER — MAGNESIUM CITRATE PO SOLN
1.0000 | Freq: Once | ORAL | Status: AC | PRN
  Filled 2014-12-22: qty 296

## 2014-12-22 MED ORDER — CLINDAMYCIN PHOSPHATE 600 MG/50ML IV SOLN
600.0000 mg | Freq: Three times a day (TID) | INTRAVENOUS | Status: DC
  Administered 2014-12-22 – 2014-12-24 (×5): 600 mg via INTRAVENOUS
  Filled 2014-12-22 (×7): qty 50

## 2014-12-22 MED ORDER — DOCUSATE SODIUM 100 MG PO CAPS
100.0000 mg | ORAL_CAPSULE | Freq: Two times a day (BID) | ORAL | Status: DC
  Administered 2014-12-22 – 2014-12-24 (×4): 100 mg via ORAL
  Filled 2014-12-22 (×4): qty 1

## 2014-12-22 MED ORDER — HEPARIN SODIUM (PORCINE) 5000 UNIT/ML IJ SOLN
5000.0000 [IU] | Freq: Three times a day (TID) | INTRAMUSCULAR | Status: DC
  Administered 2014-12-22 – 2014-12-24 (×5): 5000 [IU] via SUBCUTANEOUS
  Filled 2014-12-22 (×3): qty 1

## 2014-12-22 NOTE — ED Notes (Signed)
Visitor's name & name Richardson Chiquito [phone]

## 2014-12-22 NOTE — ED Provider Notes (Signed)
CSN: [id number]     Arrival date & time 12/22/14  0000 History   First MD Initiated Contact with Patient 12/22/14 0242     Chief Complaint  Patient presents with  . Facial Swelling     (Consider location/radiation/quality/duration/timing/severity/associated sxs/prior Treatment) The history is provided by the patient.   34 year old male comes in with swelling of the left lower jaw which has been present for about 3 days but got significantly worse today. He was seen in emergency at [facility] 2 days ago and given a prescription for clindamycin. He did get the prescription filled, but symptoms are getting worse in spite of that. He has tried to get into see a dentist but has not been able to do so. Is complaining of severe pain which he rates at 10/10. There is no difficulty breathing or swallowing. He is not aware of any fever and there've been no chills or sweats.  Past Medical History  Diagnosis Date  . MS (multiple sclerosis)   . Anxiety    History reviewed. No pertinent past surgical history. Family History  Problem Relation Age of Onset  . Cancer Mother    History  Substance Use Topics  . Smoking status: Current Some Day Smoker  . Smokeless tobacco: Never Used  . Alcohol Use: Yes     Comment: 1-2 drinks per month    Review of Systems  All other systems reviewed and are negative.     Allergies  Penicillins  Home Medications   Prior to Admission medications   Medication Sig Start Date End Date Taking? Authorizing Provider  clindamycin (CLEOCIN) 150 MG capsule 1 po qid with meals and hs 12/21/14   Ivery Quale, PA-C  gabapentin (NEURONTIN) 800 MG tablet Take 1 tablet by mouth 4 (four) times daily. 09/29/13   Historical Provider, MD  HYDROcodone-acetaminophen (NORCO/VICODIN) 5-325 MG per tablet Take one-two tabs po q 4-6 hrs prn pain 02/12/14   Tammi Triplett, PA-C  HYDROcodone-acetaminophen (NORCO/VICODIN) 5-325 MG per tablet Take 2 tablets by mouth every 4 (four)  hours as needed. 06/17/14   Elson Areas, PA-C  ibuprofen (ADVIL,MOTRIN) 800 MG tablet Take 1 tablet (800 mg total) by mouth 3 (three) times daily. 12/21/14   Ivery Quale, PA-C  naproxen (NAPROSYN) 500 MG tablet Take 1 tablet (500 mg total) by mouth 2 (two) times daily. 02/12/14   Tammi Triplett, PA-C   BP 143/80 mmHg  Pulse 70  Temp(Src) 97.5 F (36.4 C) (Oral)  Resp 18  Ht  (1.854 m)  Wt 150 lb (68.04 kg)  BMI 19.79 kg/m2  SpO2 100% Physical Exam  Nursing note and vitals reviewed.  34 year old male, resting comfortably and in no acute distress. Vital signs are significant for hypertension. Oxygen saturation is 100%, which is normal. Head is normocephalic and atraumatic. PERRLA, EOMI. Oropharynx is moderate gingival swelling on the left side lower adjacent to teeth #18, 19, 20. There is swelling on the lower jaw on that side. There is no crepitus. Swelling extends into the sub-mental area he has no difficulty with secretions and phonation is normal. There is no stridor. Neck is nontender and supple without adenopathy or JVD. Back is nontender and there is no CVA tenderness. Lungs are clear without rales, wheezes, or rhonchi. Chest is nontender. Heart has regular rate and rhythm without murmur. Abdomen is soft, flat, nontender without masses or hepatosplenomegaly and peristalsis is normoactive. Extremities have no cyanosis or edema, full range of motion is present.  Skin is warm and dry without rash. Neurologic: Mental status is normal, cranial nerves are intact, there are no motor or sensory deficits.  ED Course  Procedures (including critical care time) Labs Review Results for orders placed or performed during the hospital encounter of 12/22/14  Basic metabolic panel  Result Value Ref Range   Sodium 137 135 - 145 mmol/L   Potassium 3.9 3.5 - 5.1 mmol/L   Chloride 104 96 - 112 mmol/L   CO2 25 19 - 32 mmol/L   Glucose, Bld 109 (H) 70 - 99 mg/dL   BUN 12 6 - 23 mg/dL    Creatinine, Ser 9.51 0.50 - 1.35 mg/dL   Calcium 9.0 8.4 - 88.4 mg/dL   GFR calc non Af Amer >90 >90 mL/min   GFR calc Af Amer >90 >90 mL/min   Anion gap 8 5 - 15  CBC with Differential  Result Value Ref Range   WBC 14.3 (H) 4.0 - 10.5 K/uL   RBC 4.51 4.22 - 5.81 MIL/uL   Hemoglobin 13.5 13.0 - 17.0 g/dL   HCT 16.6 06.3 - 01.6 %   MCV 88.0 78.0 - 100.0 fL   MCH 29.9 26.0 - 34.0 pg   MCHC 34.0 30.0 - 36.0 g/dL   RDW 01.0 93.2 - 35.5 %   Platelets 208 150 - 400 K/uL   Neutrophils Relative % 81 (H) 43 - 77 %   Neutro Abs 11.6 (H) 1.7 - 7.7 K/uL   Lymphocytes Relative 7 (L) 12 - 46 %   Lymphs Abs 1.0 0.7 - 4.0 K/uL   Monocytes Relative 11 3 - 12 %   Monocytes Absolute 1.6 (H) 0.1 - 1.0 K/uL   Eosinophils Relative 1 0 - 5 %   Eosinophils Absolute 0.2 0.0 - 0.7 K/uL   Basophils Relative 0 0 - 1 %   Basophils Absolute 0.0 0.0 - 0.1 K/uL    Imaging Review Ct Soft Tissue Neck W Contrast  12/22/2014   EXAM: CT NECK WITH CONTRAST  TECHNIQUE: Multidetector CT imaging of the neck was performed using the standard protocol following the bolus administration of intravenous contrast.  CONTRAST:  52mL OMNIPAQUE IOHEXOL 300 MG/ML  SOLN  COMPARISON:  None.  FINDINGS: Visualized portions of the brain demonstrate a normal appearance. Visualized globes are unremarkable.  Small retention cyst noted within the right maxillary sinus. Paranasal sinuses are otherwise clear. No mastoid effusion. Middle ear cavities clear.  Salivary glands including the parotid glands and submandibular glands are within normal limits.  There is diffuse inflammatory stranding with soft tissue swelling throughout the left face, involving the masticator space extending into the submental and sub lingual region on the left. Findings consistent with cellulitis. Dental caries present within the left first mandibular molar. Adjacent to this, there is a large rim enhancing hypodense collection measuring approximately 1.5 x 3.7 x 3.0 cm at  the lateral non shin of the left mandibular body, compatible with an odontogenic abscess (series 2, image 51). No other ab cyst seen within the oral cavity.  Palatine tonsils within normal limits. Parapharyngeal fat preserved. Nasopharynx within normal limits. No retropharyngeal collection or abscess. Hypopharynx and supraglottic larynx within normal limits. Epiglottis normal. Vallecula is clear. True vocal cords symmetric. Subglottic airway is clear.  Thyroid gland unremarkable.  Multiple enlarged left level 1B nodes measure up to 10 mm in short axis are present, likely reactive. Level 1A nodes measure up the 13 mm.  There is a 3 mm nodule within  the right upper lobe, indeterminate. Visualized lungs are otherwise clear.  Normal intravascular enhancement seen within the neck.  No acute osseous abnormality. No worrisome lytic or blastic osseous lesion.  IMPRESSION: 1. 1.5 x 3.7 x 3.0 cm odontogenic abscess adjacent to the first left mandibular molar as above. Associated inflammatory stranding and swelling within the left face is consistent with associated cellulitis. 2. Enlarged submental adenopathy, likely reactive in nature. 3. 3 mm right upper lobe pulmonary nodule, indeterminate. Please note that Fleischner criteria do not apply in patients of this age.   Electronically Signed   By: Rise Mu M.D.   On: 12/22/2014 04:38   Images viewed by me.  MDM   Final diagnoses:  Facial abscess    Dental abscess which is felt to respond to outpatient antibiotics. He'll be sent for CT scan to further delineate the area of infection and he is given intravenous clindamycin. Old records are reviewed confirming recent ED visit for dental infection with swelling of the lower jaw. He also has a prior ED visit in about 2 years ago for dental pain  CT shows large abscess. Case is discussed with Dr.Teoh, on call for ENT who states that the rest management would be to admit under the medicine service and  Have  oral surgery see the patient in consult. Case is discussed with Dr. Toniann Fail of [facility] who accepts the patient in transfer to [facility] for admission.  Dione Booze, MD 12/22/14 [phone]

## 2014-12-22 NOTE — ED Notes (Signed)
Pts male visitor came to the nursing station asking "when is the doctor going to make his rounds.  We've been here 3 hours."  Visitor was told that the pt checked in 1 hr 45 minutes ago and she was assured that the doctor would be in as soon as he could.

## 2014-12-22 NOTE — ED Notes (Signed)
Pt came to nsg station to ask how long until he would be seen and if he had been forgotten.  Pt was assured that he has not been forgotten and that the doctor would be in as soon as he could.

## 2014-12-22 NOTE — Progress Notes (Signed)
Dr. Malachi Bonds notified of arrival to 28n05 from [facility].Marland Kitchen

## 2014-12-22 NOTE — H&P (Addendum)
[facility] History and Physical  Jack Hopkins:[id number] DOB: 1980/12/26 DOA: 12/22/2014  Referring physician:  Dione Booze PCP:  [name], MD   Chief Complaint:  Facial swelling  HPI:  The patient is a 34 y.o. year-old male with history of multiple sclerosis, not on immunosuppression, and alcohol abuse now in remission, and poor dentition who presents with facial pain and swelling.  The patient was last at their baseline health until about a week ago.  He developed some achy pain along his left jaw line.  He was sent home with a prescription for clindamycin  po four times daily which he filled.  He states the pain and swelling of the left jaw got much worse despite the antibiotics and he returned to the [facility] urgent care for reevaluation.  Pain was 10/10 and he noticed some pus draining into his mouth from the swollen area but the pressure/tenderness was not relieved.  CT scan demonstrated a 1.5x3.7x3 cm abscess of the left first mandibular molar with some associated shotty lymphadenopathy.  He also had an incidentally found 3mm right upper lobe pulmonary nodule.  He was transferred to [facility] for IV antibiotics and oral surgery evaluation.  He denies systemic symptoms such as fevers, chills, nausea, vomiting, and diarrhea.  He has been able to drink some.    Review of Systems:  General:  Denies fevers, chills, weight loss or gain HEENT:  Denies changes to hearing and vision, rhinorrhea, sinus congestion, sore throat.  + dental pain as above CV:  Denies chest pain and palpitations, lower extremity edema.  PULM:  Denies SOB, wheezing, cough.   GI:  Denies nausea, vomiting, constipation, diarrhea.   GU:  Denies dysuria, frequency, urgency ENDO:  Denies polyuria, polydipsia.   HEME:  Denies hematemesis, blood in stools, melena, abnormal bruising or bleeding.  LYMPH:  Denies lymphadenopathy.   MSK:  Denies arthralgias, myalgias.   DERM:  Denies skin rash or ulcer.   NEURO:   Denies focal numbness, weakness, slurred speech, confusion, facial droop.  PSYCH:  + anxiety and denies depression.    Past Medical History  Diagnosis Date  . MS (multiple sclerosis)     not on immunosuppression, no numbness or weakness  . Anxiety    History reviewed. No pertinent past surgical history. Social History:  reports that he has been smoking.  He has never used smokeless tobacco. He reports that he drinks alcohol. He reports that he does not use illicit drugs. Currently no health or dental insurance.  Does not start until May 1st.    Allergies  Allergen Reactions  . Penicillins Other (See Comments)    unknown    Family History  Problem Relation Age of Onset  . Leukemia Mother 40  . Multiple sclerosis Neg Hx   . Diabetes Neg Hx      Prior to Admission medications   Medication Sig Start Date End Date Taking? Authorizing Provider  Aspirin-Acetaminophen-Caffeine (EXCEDRIN EXTRA STRENGTH PO) Take 2 tablets by mouth daily as needed (pain).   Yes Historical Provider, MD  clindamycin (CLEOCIN) 150 MG capsule 1 po qid with meals and hs 12/21/14   Ivery Quale, PA-C  diazepam (VALIUM) 10 MG tablet Take 10 mg by mouth daily as needed for anxiety (MS spasms).  12/09/14   Historical Provider, MD  gabapentin (NEURONTIN) 800 MG tablet Take 1 tablet by mouth 4 (four) times daily. 09/29/13   Historical Provider, MD  HYDROcodone-acetaminophen (NORCO/VICODIN) 5-325 MG per tablet Take one-two tabs  po q 4-6 hrs prn pain Patient not taking: Reported on 12/22/2014 02/12/14   Tammi Triplett, PA-C  HYDROcodone-acetaminophen (NORCO/VICODIN) 5-325 MG per tablet Take 2 tablets by mouth every 4 (four) hours as needed. Patient not taking: Reported on 12/22/2014 06/17/14   Elson Areas, PA-C  ibuprofen (ADVIL,MOTRIN) 800 MG tablet Take 1 tablet (800 mg total) by mouth 3 (three) times daily. Patient not taking: Reported on 12/22/2014 12/21/14   Ivery Quale, PA-C  naproxen (NAPROSYN) 500 MG tablet Take 1  tablet (500 mg total) by mouth 2 (two) times daily. Patient not taking: Reported on 12/22/2014 02/12/14   Severiano Gilbert, PA-C   Physical Exam: Filed Vitals:   12/22/14 1044 12/22/14 1332 12/22/14 1608 12/22/14 1715  BP: 123/65 121/59 117/63 128/62  Pulse: 86  67 71  Temp: 99.4 F (37.4 C) 99.6 F (37.6 C)  98.8 F (37.1 C)  TempSrc: Oral Oral  Oral  Resp: 20 18 16 16   Height:      Weight:      SpO2: 100% 99% 99% 100%     General:  Thin male, NAD  Eyes:  PERRL, anicteric, non-injected.  ENT:  Nares clear.  OP clear, poor dentition with multiple teeth rotted to the gum line, obvious dental caries, left lower gum line erythematous and friable-looking, currently no obvious pus.  Left jaw is swollen and TTP with no significant overlying erythema.    Neck:  Supple without TM or JVD.    Lymph:  + left posterior cervical chain, submandibular LN.  No supraclavicular LN  Cardiovascular:  RRR, normal S1, S2, without m/r/g.  2+ pulses, warm extremities  Respiratory:  CTA bilaterally without increased WOB.  Abdomen:  NABS.  Soft, ND/NT.    Skin:  No rashes or focal lesions.  Musculoskeletal:  Normal bulk and tone.  No LE edema.  Psychiatric:  A & O x 4.  Appropriate affect.  Neurologic:  CN 3-12 intact.  5/5 strength.  Sensation intact.  Labs on Admission:  Basic Metabolic Panel:  Recent Labs Lab 12/22/14 0305  NA 137  K 3.9  CL 104  CO2 25  GLUCOSE 109*  BUN 12  CREATININE 1.02  CALCIUM 9.0   Liver Function Tests: No results for input(s): AST, ALT, ALKPHOS, BILITOT, PROT, ALBUMIN in the last 168 hours. No results for input(s): LIPASE, AMYLASE in the last 168 hours. No results for input(s): AMMONIA in the last 168 hours. CBC:  Recent Labs Lab 12/22/14 0305  WBC 14.3*  NEUTROABS 11.6*  HGB 13.5  HCT 39.7  MCV 88.0  PLT 208   Cardiac Enzymes: No results for input(s): CKTOTAL, CKMB, CKMBINDEX, TROPONINI in the last 168 hours.  BNP (last 3 results) No  results for input(s): BNP in the last 8760 hours.  ProBNP (last 3 results) No results for input(s): PROBNP in the last 8760 hours.  CBG: No results for input(s): GLUCAP in the last 168 hours.  Radiological Exams on Admission: Ct Soft Tissue Neck W Contrast  12/22/2014   EXAM: CT NECK WITH CONTRAST  TECHNIQUE: Multidetector CT imaging of the neck was performed using the standard protocol following the bolus administration of intravenous contrast.  CONTRAST:  75mL OMNIPAQUE IOHEXOL 300 MG/ML  SOLN  COMPARISON:  None.  FINDINGS: Visualized portions of the brain demonstrate a normal appearance. Visualized globes are unremarkable.  Small retention cyst noted within the right maxillary sinus. Paranasal sinuses are otherwise clear. No mastoid effusion. Middle ear cavities clear.  Salivary glands  including the parotid glands and submandibular glands are within normal limits.  There is diffuse inflammatory stranding with soft tissue swelling throughout the left face, involving the masticator space extending into the submental and sub lingual region on the left. Findings consistent with cellulitis. Dental caries present within the left first mandibular molar. Adjacent to this, there is a large rim enhancing hypodense collection measuring approximately 1.5 x 3.7 x 3.0 cm at the lateral non shin of the left mandibular body, compatible with an odontogenic abscess (series 2, image 51). No other ab cyst seen within the oral cavity.  Palatine tonsils within normal limits. Parapharyngeal fat preserved. Nasopharynx within normal limits. No retropharyngeal collection or abscess. Hypopharynx and supraglottic larynx within normal limits. Epiglottis normal. Vallecula is clear. True vocal cords symmetric. Subglottic airway is clear.  Thyroid gland unremarkable.  Multiple enlarged left level 1B nodes measure up to 10 mm in Jacquese Hackman axis are present, likely reactive. Level 1A nodes measure up the 13 mm.  There is a 3 mm nodule within  the right upper lobe, indeterminate. Visualized lungs are otherwise clear.  Normal intravascular enhancement seen within the neck.  No acute osseous abnormality. No worrisome lytic or blastic osseous lesion.  IMPRESSION: 1. 1.5 x 3.7 x 3.0 cm odontogenic abscess adjacent to the first left mandibular molar as above. Associated inflammatory stranding and swelling within the left face is consistent with associated cellulitis. 2. Enlarged submental adenopathy, likely reactive in nature. 3. 3 mm right upper lobe pulmonary nodule, indeterminate. Please note that Fleischner criteria do not apply in patients of this age.   Electronically Signed   By: Rise Mu M.D.   On: 12/22/2014 04:38    Assessment/Plan Active Problems:   Facial abscess  ---  Apical abscess -  Soft diet -  NPO at MN with IVF -  Clindamycin  IV q8h -  Chlorhexidine rinse -  Oral surgery consult in AM (no one on call this evening):  Can call [phone] -  Tylenol and oxycodone for pain -  Ibuprofen to help with pain and inflammation -  HIV and A1c  MS, chronic, stable, asymptomatic and not in immunosuppression  Anxiety, stable, continue valium  Leukocytosis, likely related to abscess -  Repeat CBC in AM  Pulmonary nodule, 3mm, infrequent smoker -  Consider f/u CT chest in 12 months  Diet:  soft Access:  PIV IVF:  yes Proph:  heparin  Code Status: full Family Communication: patient and girlfriend Disposition Plan: Admit to med-surg  Time spent: 60 min Renae Fickle [facility] Pager [phone]  If 7PM-7AM, please contact night-coverage [email or web address] Password [facility] 12/22/2014, 6:29 PM

## 2014-12-22 NOTE — ED Notes (Signed)
Patient states that he was seen and treated 2 days ago for the swelling, the patient reports that the swelling is worse and the pain is worse

## 2014-12-22 NOTE — ED Notes (Signed)
MD at bedside. 

## 2014-12-23 ENCOUNTER — Encounter (HOSPITAL_COMMUNITY): Payer: Self-pay | Admitting: General Practice

## 2014-12-23 LAB — CBC
HCT: 37.6 % — ABNORMAL LOW (ref 39.0–52.0)
Hemoglobin: 12.6 g/dL — ABNORMAL LOW (ref 13.0–17.0)
MCH: 29.6 pg (ref 26.0–34.0)
MCHC: 33.5 g/dL (ref 30.0–36.0)
MCV: 88.5 fL (ref 78.0–100.0)
Platelets: 198 10*3/uL (ref 150–400)
RBC: 4.25 MIL/uL (ref 4.22–5.81)
RDW: 13.6 % (ref 11.5–15.5)
WBC: 8.9 10*3/uL (ref 4.0–10.5)

## 2014-12-23 LAB — BASIC METABOLIC PANEL
ANION GAP: 6 (ref 5–15)
BUN: 6 mg/dL (ref 6–23)
CO2: 28 mmol/L (ref 19–32)
CREATININE: 0.97 mg/dL (ref 0.50–1.35)
Calcium: 9 mg/dL (ref 8.4–10.5)
Chloride: 103 mmol/L (ref 96–112)
GFR calc Af Amer: >90 mL/min (ref >90)
GFR calc non Af Amer: >90 mL/min (ref >90)
Glucose, Bld: 124 mg/dL — ABNORMAL HIGH (ref 70–99)
Potassium: 3.8 mmol/L (ref 3.5–5.1)
SODIUM: 137 mmol/L (ref 135–145)

## 2014-12-23 LAB — HIV ANTIBODY (ROUTINE TESTING W REFLEX): HIV SCREEN 4TH GENERATION: NONREACTIVE

## 2014-12-23 MED ORDER — SACCHAROMYCES BOULARDII 250 MG PO CAPS
250.0000 mg | ORAL_CAPSULE | Freq: Two times a day (BID) | ORAL | Status: DC
  Administered 2014-12-23 – 2014-12-24 (×3): 250 mg via ORAL
  Filled 2014-12-23 (×5): qty 1

## 2014-12-23 MED ORDER — OXYCODONE HCL 5 MG PO TABS
10.0000 mg | ORAL_TABLET | ORAL | Status: DC | PRN
  Administered 2014-12-23 – 2014-12-24 (×5): 10 mg via ORAL
  Filled 2014-12-23 (×5): qty 2

## 2014-12-23 MED ORDER — PNEUMOCOCCAL VAC POLYVALENT 25 MCG/0.5ML IJ INJ: 0.5000 mL | INJECTION | INTRAMUSCULAR | Status: DC

## 2014-12-23 NOTE — Progress Notes (Addendum)
[facility] PROGRESS NOTE  Jack Hopkins:[id number] DOB: 1980-12-08 DOA: 12/22/2014 PCP: [name], MD  Brief Summary  The patient is a 34 y.o. year-old male with history of multiple sclerosis not on immunosuppression, alcohol abuse now in remission, and poor dentition who presented with progressively worsening facial pain and swelling for one week prior to admission.  Two days prior to admission he was sent home with a prescription for clindamycin  po four times daily which he filled and took, however, his pain and swelling rapidly worsened. He returned to the [facility] urgent care and was admitted for IV antibiotics and dentistry evaluation.  CT scan demonstrated a 1.5x3.7x3 cm abscess of the left first mandibular molar with some associated lymphadenopathy. He also had an incidentally found 3mm right upper lobe pulmonary nodule. He denied systemic symptoms such as fevers, chills, nausea, vomiting, and diarrhea. He had some spontaneous drainage and improvement on IV antibiotics.    Assessment/Plan  Apical abscess, failed outpatient antibiotics - Resume soft diet > had to leave message for dentistry so unlikely he will have surgery today - Clindamycin  IV q8h - Chlorhexidine rinse - Tylenol  -  Increase oxycodone to   - Ibuprofen to help with pain and inflammation - HIV and A1c pending -  Add florastor  MS, chronic, stable, asymptomatic and not in immunosuppression  Anxiety, stable, continue valium  Leukocytosis, likely related to abscess, resolved.  Mild normocytic anemia, likely related to marrow suppression/mild hemolysis from acute illness -  Repeat as outpatient  Pulmonary nodule, 3mm, infrequent smoker - Consider f/u CT chest in 12 months  Diet: soft Access: PIV IVF: yes Proph: heparin  Code Status: full Family Communication: patient and girlfriend Disposition Plan:  Continue IV antibiotics.  If continuing to spontaneously drain,  possible d/c tomorrow on higher dose of oral antibiotics, but defer to dentistry if they feel he needs further inpatient drainage.  Will likely need post-treatment imaging and does not have active insurance.  Consultants:  Dr. Kirtland Bouchard  Procedures:  CT scan face  Antibiotics:  Clindamycin IV 4/6 >>   HPI/Subjective:  Drained spontaneously some overnight and feeling better this AM.  Face is less painful and swollen  Objective: Filed Vitals:   12/22/14 1608 12/22/14 1715 12/22/14 2148 12/23/14 0604  BP: 117/63 128/62 127/55 116/62  Pulse: 67 71 75 51  Temp:  98.8 F (37.1 C) 98.8 F (37.1 C) 97.9 F (36.6 C)  TempSrc:  Oral Oral Oral  Resp: Height:      Weight:      SpO2: 99% 100% 98% 100%    Intake/Output Summary (Last 24 hours) at 12/23/14 8119 Last data filed at 12/23/14 0631  Gross per 24 hour  Intake 1046.25 ml  Output    900 ml  Net 146.25 ml   Filed Weights   12/22/14 0006  Weight: 68.04 kg (150 lb)    Exam:   General:  Thin male, No acute distress  HEENT:  MMM,  poor dentition with multiple teeth rotted to the gum line, obvious dental caries, left lower gum line less erythematous and friable-looking. Left jaw is less swollen but still TTP with no significant overlying erythema.   Cardiovascular:  RRR, nl S1, S2 no mrg, 2+ pulses, warm extremities  Respiratory:  CTAB, no increased WOB  Abdomen:   NABS, soft, NT/ND  MSK:   Normal tone and bulk, no LEE  Neuro:  Grossly intact  Data Reviewed: Basic Metabolic  Panel:  Recent Labs Lab 12/22/14 0305 12/23/14 0538  NA 137 137  K 3.9 3.8  CL 104 103  CO2 25 28  GLUCOSE 109* 124*  BUN 12 6  CREATININE 1.02 0.97  CALCIUM 9.0 9.0   Liver Function Tests: No results for input(s): AST, ALT, ALKPHOS, BILITOT, PROT, ALBUMIN in the last 168 hours. No results for input(s): LIPASE, AMYLASE in the last 168 hours. No results for input(s): AMMONIA in the last 168 hours. CBC:  Recent  Labs Lab 12/22/14 0305 12/23/14 0538  WBC 14.3* 8.9  NEUTROABS 11.6*  --   HGB 13.5 12.6*  HCT 39.7 37.6*  MCV 88.0 88.5  PLT 208 198   Cardiac Enzymes: No results for input(s): CKTOTAL, CKMB, CKMBINDEX, TROPONINI in the last 168 hours. BNP (last 3 results) No results for input(s): BNP in the last 8760 hours.  ProBNP (last 3 results) No results for input(s): PROBNP in the last 8760 hours.  CBG: No results for input(s): GLUCAP in the last 168 hours.  No results found for this or any previous visit (from the past 240 hour(s)).   Studies: Ct Soft Tissue Neck W Contrast  12/22/2014   EXAM: CT NECK WITH CONTRAST  TECHNIQUE: Multidetector CT imaging of the neck was performed using the standard protocol following the bolus administration of intravenous contrast.  CONTRAST:  80mL OMNIPAQUE IOHEXOL 300 MG/ML  SOLN  COMPARISON:  None.  FINDINGS: Visualized portions of the brain demonstrate a normal appearance. Visualized globes are unremarkable.  Small retention cyst noted within the right maxillary sinus. Paranasal sinuses are otherwise clear. No mastoid effusion. Middle ear cavities clear.  Salivary glands including the parotid glands and submandibular glands are within normal limits.  There is diffuse inflammatory stranding with soft tissue swelling throughout the left face, involving the masticator space extending into the submental and sub lingual region on the left. Findings consistent with cellulitis. Dental caries present within the left first mandibular molar. Adjacent to this, there is a large rim enhancing hypodense collection measuring approximately 1.5 x 3.7 x 3.0 cm at the lateral non shin of the left mandibular body, compatible with an odontogenic abscess (series 2, image 51). No other ab cyst seen within the oral cavity.  Palatine tonsils within normal limits. Parapharyngeal fat preserved. Nasopharynx within normal limits. No retropharyngeal collection or abscess. Hypopharynx and  supraglottic larynx within normal limits. Epiglottis normal. Vallecula is clear. True vocal cords symmetric. Subglottic airway is clear.  Thyroid gland unremarkable.  Multiple enlarged left level 1B nodes measure up to 10 mm in Linder Prajapati axis are present, likely reactive. Level 1A nodes measure up the 13 mm.  There is a 3 mm nodule within the right upper lobe, indeterminate. Visualized lungs are otherwise clear.  Normal intravascular enhancement seen within the neck.  No acute osseous abnormality. No worrisome lytic or blastic osseous lesion.  IMPRESSION: 1. 1.5 x 3.7 x 3.0 cm odontogenic abscess adjacent to the first left mandibular molar as above. Associated inflammatory stranding and swelling within the left face is consistent with associated cellulitis. 2. Enlarged submental adenopathy, likely reactive in nature. 3. 3 mm right upper lobe pulmonary nodule, indeterminate. Please note that Fleischner criteria do not apply in patients of this age.   Electronically Signed   By: Rise Mu M.D.   On: 12/22/2014 04:38    Scheduled Meds: . chlorhexidine  15 mL Mouth/Throat BID  . clindamycin (CLEOCIN) IV  600 mg Intravenous 3 times per day  . docusate  sodium  100 mg Oral BID  . gabapentin  800 mg Oral QID  . heparin  5,000 Units Subcutaneous 3 times per day  . ibuprofen  800 mg Oral TID  . [START ON 12/24/2014] pneumococcal 23 valent vaccine  0.5 mL Intramuscular Tomorrow-1000   Continuous Infusions: . dextrose 5 % and 0.45 % NaCl with KCl 20 mEq/L 75 mL/hr at 12/23/14 0042    Active Problems:   Facial abscess   Apical abscess   Leukocytosis   MS (multiple sclerosis)   Generalized anxiety disorder    Time spent: 30 min    Vinayak Bobier, [facility]  [facility] Pager [phone]. If 7PM-7AM, please contact night-coverage at [email or web address], password [facility] 12/23/2014, 9:07 AM  LOS: 1 day

## 2014-12-24 DIAGNOSIS — R911 Solitary pulmonary nodule: Secondary | ICD-10-CM

## 2014-12-24 LAB — HEMOGLOBIN A1C
HEMOGLOBIN A1C: 5.6 % (ref 4.8–5.6)
Mean Plasma Glucose: 114 mg/dL

## 2014-12-24 MED ORDER — SACCHAROMYCES BOULARDII 250 MG PO CAPS: 250.0000 mg | ORAL_CAPSULE | Freq: Two times a day (BID) | ORAL | Status: AC

## 2014-12-24 MED ORDER — CHLORHEXIDINE GLUCONATE 0.12 % MT SOLN: 15.0000 mL | Freq: Two times a day (BID) | OROMUCOSAL | Status: AC

## 2014-12-24 MED ORDER — IBUPROFEN 800 MG PO TABS: 800.0000 mg | ORAL_TABLET | Freq: Three times a day (TID) | ORAL | Status: AC

## 2014-12-24 MED ORDER — OXYCODONE HCL 10 MG PO TABS: 10.0000 mg | ORAL_TABLET | Freq: Four times a day (QID) | ORAL | Status: AC | PRN

## 2014-12-24 MED ORDER — CLINDAMYCIN HCL 150 MG PO CAPS: 450.0000 mg | ORAL_CAPSULE | Freq: Three times a day (TID) | ORAL | Status: DC

## 2014-12-24 MED ORDER — CLINDAMYCIN HCL 150 MG PO CAPS: 450.0000 mg | ORAL_CAPSULE | Freq: Three times a day (TID) | ORAL | Status: AC

## 2014-12-24 NOTE — Progress Notes (Signed)
Discharge home. Home discharge instructions given, no questions verbalized. 

## 2014-12-24 NOTE — Discharge Summary (Addendum)
Physician Discharge Summary  Jack Hopkins:[id number] DOB: 1981-07-13 DOA: 12/22/2014  PCP: [name], MD  Admit date: 12/22/2014 Discharge date: 12/24/2014  Recommendations for Outpatient Follow-up:  1. F/u with Dr. Randa Evens office within 1 week of discharge 2. Given higher dose of oral clindamycin 3. Advised to use chlorhexidine rinse 4. PCP appointment to discuss pulmonary nodule within 1 month.  Needs repeat CBC in 2 weeks to follow up mild anemia.  If persistent, work up for anemia if not already complete.  Discharge Diagnoses:  Active Problems:   Facial abscess   Apical abscess   Leukocytosis   MS (multiple sclerosis)   Generalized anxiety disorder   Solitary pulmonary nodule   Discharge Condition: stable, improved  Diet recommendation: soft   Wt Readings from Last 3 Encounters:  12/22/14 68.04 kg (150 lb)  12/20/14 68.04 kg (150 lb)  02/12/14 70.761 kg (156 lb)    History of present illness:  The patient is a 34 y.o. year-old male with history of multiple sclerosis not on immunosuppression, alcohol abuse now in remission, and poor dentition who presented with progressively worsening facial pain and swelling for one week prior to admission. Two days prior to admission he was sent home with a prescription for clindamycin 150mg  po four times daily which he filled and took, however, his pain and swelling rapidly worsened. He returned to the [facility] urgent care and was admitted for IV antibiotics and dentistry evaluation. CT scan demonstrated a 1.5x3.7x3 cm abscess of the left first mandibular molar with some associated lymphadenopathy. He also had an incidentally found 3mm right upper lobe pulmonary nodule. He denied systemic symptoms such as fevers, chills, nausea, vomiting, and diarrhea. He had spontaneous drainage and clinical improvement on IV antibiotics.  Case was discussed with Dr. Ronne Binning who agreed to see the patient in follow up.  His oral clindamycin dose was  increased to 450mg  and the patient was advised to call to schedule his follow up appointment on Monday.  I have also left a message with office staff to have them contact patient on Monday to schedule follow up.    Hospital Course:   Apical abscess, failed outpatient antibiotics - Started on Clindamycin 600mg  IV q8h - Started chlorhexidine rinse - Given prescription oxycodone to 10mg  and ibuprofen 800mg  TID - HIV NR and A1c 5.6 - Given prescription for florastor  MS, chronic, stable, asymptomatic and not in immunosuppression  Anxiety, stable, continue valium  Leukocytosis, likely related to abscess, resolved.  Mild normocytic anemia, likely related to marrow suppression/mild hemolysis from acute illness - Repeat as outpatient  Pulmonary nodule, 3mm, infrequent smoker - Consider f/u CT chest in 12 months  Consultants:  None  Procedures:  CT scan face  Antibiotics:  Clindamycin IV 4/6 >>  Discharge Exam: Filed Vitals:   12/24/14 0555  BP: 124/43  Pulse: 68  Temp: 97.9 F (36.6 C)  Resp: 18   Filed Vitals:   12/23/14 1000 12/23/14 1400 12/23/14 2204 12/24/14 0555  BP: 112/57 121/62 131/56 124/43  Pulse: 56 86 65 68  Temp: 97.8 F (36.6 C) 97.6 F (36.4 C) 98 F (36.7 C) 97.9 F (36.6 C)  TempSrc: Oral Oral Oral Oral  Resp: 14 18 18 18   Height:      Weight:      SpO2: 100% 100% 100% 100%     General: Thin male, No acute distress  HEENT: MMM, poor dentition with multiple teeth rotted to the gum line, obvious dental caries, left  lower gum line less erythematous and friable-looking with some purulence draining at time of exam. Left jaw is much less swollen with no significant overlying erythema.   Cardiovascular: RRR, nl S1, S2 no mrg, 2+ pulses, warm extremities  Respiratory: CTAB, no increased WOB  Abdomen: NABS, soft, NT/ND  Discharge Instructions      Discharge Instructions    Call MD for:  difficulty breathing, headache or  visual disturbances    Complete by:  As directed      Call MD for:  extreme fatigue    Complete by:  As directed      Call MD for:  hives    Complete by:  As directed      Call MD for:  persistant dizziness or light-headedness    Complete by:  As directed      Call MD for:  persistant nausea and vomiting    Complete by:  As directed      Call MD for:  severe uncontrolled pain    Complete by:  As directed      Call MD for:  temperature >100.4    Complete by:  As directed      Diet general    Complete by:  As directed      Discharge instructions    Complete by:  As directed   For your abscessed tooth, please take clindamycin 450mg  three times a day until you follow up with Dr. Barbette Merino next week.  It is less important than your antibiotic, but I would also recommend using peridex mouth wash to help your gums and florastor probiotic to reduce your risk of infectious diarrhea.  Powerful antibiotics such as clindamycin can cause infectious diarrhea up to three months after you completed your antibiotic.  This type of diarrhea is dangerous and requires special antibiotics to treat it.  If you develop diarrhea in the next three months, please seek immediate medical attention and tell the doctor that you were recently on antibiotics.  For pain and swelling, please take ibuprofen 3 times a day for the next five days and use oxycodone as needed for breakthrough pain.     Driving Restrictions    Complete by:  As directed   Do not drive or operate heavy machinery while taking sedating medications such as oxycodone.     Increase activity slowly    Complete by:  As directed             Medication List    TAKE these medications        chlorhexidine 0.12 % solution  Commonly known as:  PERIDEX  Use as directed 15 mLs in the mouth or throat 2 (two) times daily.     clindamycin 150 MG capsule  Commonly known as:  CLEOCIN  Take 3 capsules (450 mg total) by mouth 3 (three) times daily.     diazepam  10 MG tablet  Commonly known as:  VALIUM  Take 10 mg by mouth daily as needed for anxiety (MS spasms).     gabapentin 800 MG tablet  Commonly known as:  NEURONTIN  Take 1 tablet by mouth 4 (four) times daily.     ibuprofen 800 MG tablet  Commonly known as:  ADVIL,MOTRIN  Take 1 tablet (800 mg total) by mouth 3 (three) times daily.     Oxycodone HCl 10 MG Tabs  Take 1 tablet (10 mg total) by mouth every 6 (six) hours as needed for moderate pain or severe pain.  saccharomyces boulardii 250 MG capsule  Commonly known as:  FLORASTOR  Take 1 capsule (250 mg total) by mouth 2 (two) times daily.       Follow-up Information    Follow up with AVBUERE,EDWIN A, MD. Schedule an appointment as soon as possible for a visit in 1 month.   Specialty:  Internal Medicine   Contact information:   [address] [place] [address] [phone]       Follow up with Georgia Lopes, DDS. Schedule an appointment as soon as possible for a visit in 1 week.   Specialty:  Oral Surgery   Why:  call on Monday for appointment next week   Contact information:   [address] [place] [address] [phone]        The results of significant diagnostics from this hospitalization (including imaging, microbiology, ancillary and laboratory) are listed below for reference.    Significant Diagnostic Studies: Ct Soft Tissue Neck W Contrast  12/22/2014   EXAM: CT NECK WITH CONTRAST  TECHNIQUE: Multidetector CT imaging of the neck was performed using the standard protocol following the bolus administration of intravenous contrast.  CONTRAST:  80mL OMNIPAQUE IOHEXOL 300 MG/ML  SOLN  COMPARISON:  None.  FINDINGS: Visualized portions of the brain demonstrate a normal appearance. Visualized globes are unremarkable.  Small retention cyst noted within the right maxillary sinus. Paranasal sinuses are otherwise clear. No mastoid effusion. Middle ear cavities clear.  Salivary glands including the parotid  glands and submandibular glands are within normal limits.  There is diffuse inflammatory stranding with soft tissue swelling throughout the left face, involving the masticator space extending into the submental and sub lingual region on the left. Findings consistent with cellulitis. Dental caries present within the left first mandibular molar. Adjacent to this, there is a large rim enhancing hypodense collection measuring approximately 1.5 x 3.7 x 3.0 cm at the lateral non shin of the left mandibular body, compatible with an odontogenic abscess (series 2, image 51). No other ab cyst seen within the oral cavity.  Palatine tonsils within normal limits. Parapharyngeal fat preserved. Nasopharynx within normal limits. No retropharyngeal collection or abscess. Hypopharynx and supraglottic larynx within normal limits. Epiglottis normal. Vallecula is clear. True vocal cords symmetric. Subglottic airway is clear.  Thyroid gland unremarkable.  Multiple enlarged left level 1B nodes measure up to 10 mm in Markeisha Mancias axis are present, likely reactive. Level 1A nodes measure up the 13 mm.  There is a 3 mm nodule within the right upper lobe, indeterminate. Visualized lungs are otherwise clear.  Normal intravascular enhancement seen within the neck.  No acute osseous abnormality. No worrisome lytic or blastic osseous lesion.  IMPRESSION: 1. 1.5 x 3.7 x 3.0 cm odontogenic abscess adjacent to the first left mandibular molar as above. Associated inflammatory stranding and swelling within the left face is consistent with associated cellulitis. 2. Enlarged submental adenopathy, likely reactive in nature. 3. 3 mm right upper lobe pulmonary nodule, indeterminate. Please note that Fleischner criteria do not apply in patients of this age.   Electronically Signed   By: Rise Mu M.D.   On: 12/22/2014 04:38    Microbiology: No results found for this or any previous visit (from the past 240 hour(s)).   Labs: Basic Metabolic  Panel:  Recent Labs Lab 12/22/14 0305 12/23/14 0538  NA 137 137  K 3.9 3.8  CL 104 103  CO2 25 28  GLUCOSE 109* 124*  BUN 12 6  CREATININE 1.02 0.97  CALCIUM 9.0 9.0   Liver Function Tests: No results for input(s): AST, ALT, ALKPHOS, BILITOT, PROT, ALBUMIN in the last 168 hours. No results for input(s): LIPASE, AMYLASE in the last 168 hours. No results for input(s): AMMONIA in the last 168 hours. CBC:  Recent Labs Lab 12/22/14 0305 12/23/14 0538  WBC 14.3* 8.9  NEUTROABS 11.6*  --   HGB 13.5 12.6*  HCT 39.7 37.6*  MCV 88.0 88.5  PLT 208 198   Cardiac Enzymes: No results for input(s): CKTOTAL, CKMB, CKMBINDEX, TROPONINI in the last 168 hours. BNP: BNP (last 3 results) No results for input(s): BNP in the last 8760 hours.  ProBNP (last 3 results) No results for input(s): PROBNP in the last 8760 hours.  CBG: No results for input(s): GLUCAP in the last 168 hours.  Time coordinating discharge: 35 minutes  Signed:  Madalina Rosman  [facility] 12/24/2014, 9:05 AM

## 2016-09-24 IMAGING — CT CT NECK W/ CM
4 of 5 series · 15 of 33 positions shown, 18 images · IV contrast (omnipaque)
Comparison: None.

EXAM:
CT NECK WITH CONTRAST
TECHNIQUE: Multidetector CT imaging of the neck was performed using the
standard protocol following the bolus administration of intravenous
contrast.

CONTRAST:  80mL OMNIPAQUE IOHEXOL 300 MG/ML  SOLN

[Series 2: neck 2.0 b31s · axial · 0.50mm/px · z∈[+1012,+1064]mm · 2 of 131 slices shown]
[im 27/131  bone]
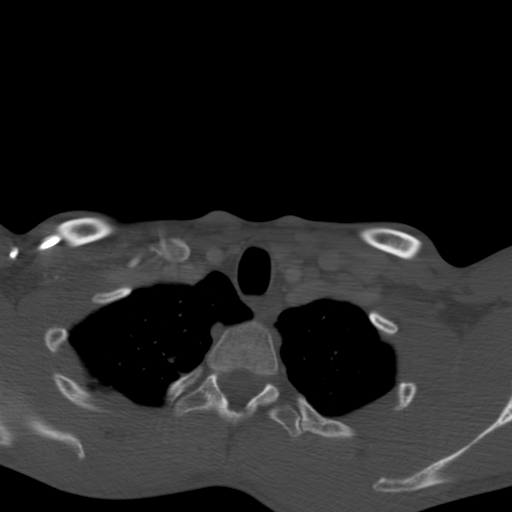
[im 53/131  bone]
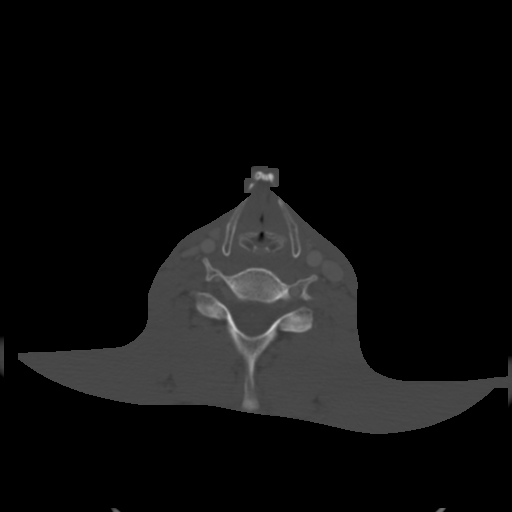

[Series 5: neck 2.0 coronal · coronal · 0.51mm/px · 3 of 118 slices shown]
[im 24/118  bone]
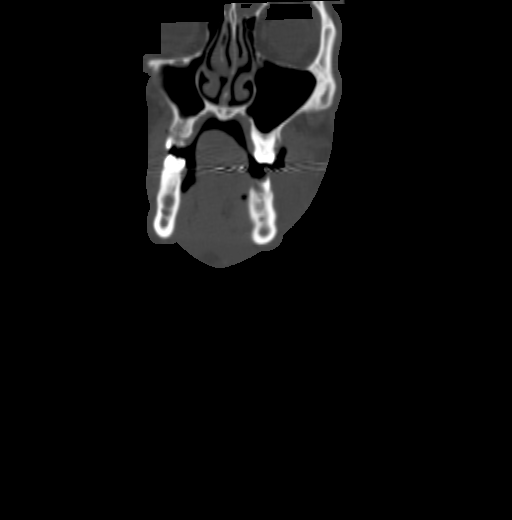
[im 47/118  bone]
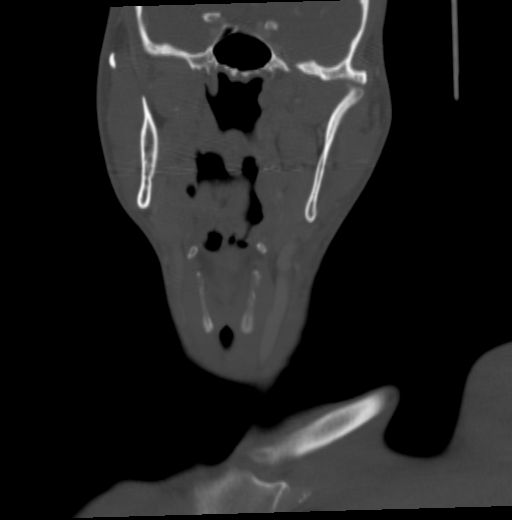
[im 71/118  bone]
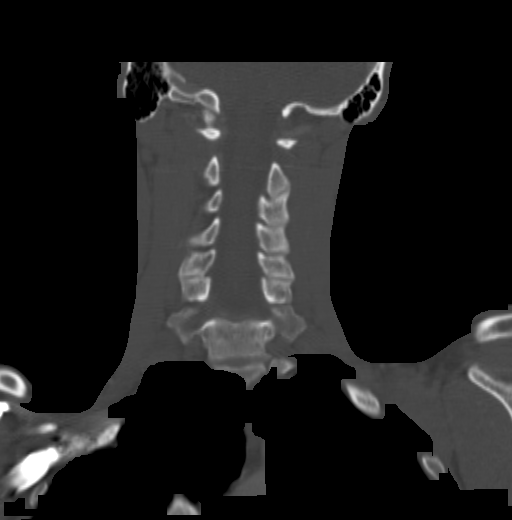

[Series 6: neck 2.0 sagittal · sagittal · 0.53mm/px · 5 of 75 slices shown, 6 images]
[im 25/75  bone]
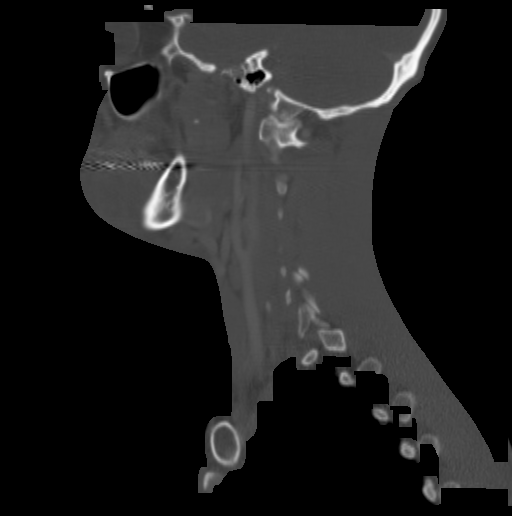
[im 31/75  bone]
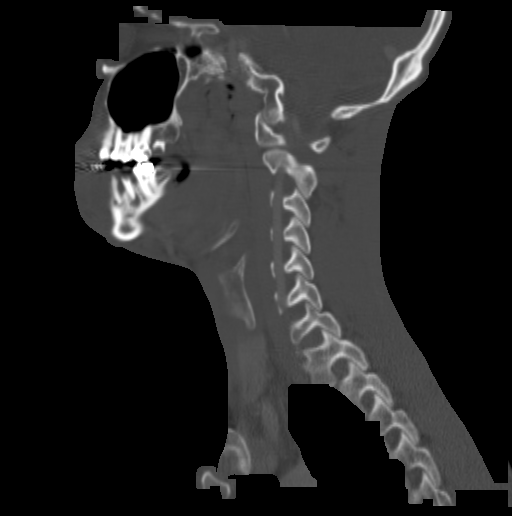
[im 38/75  soft-tissue]
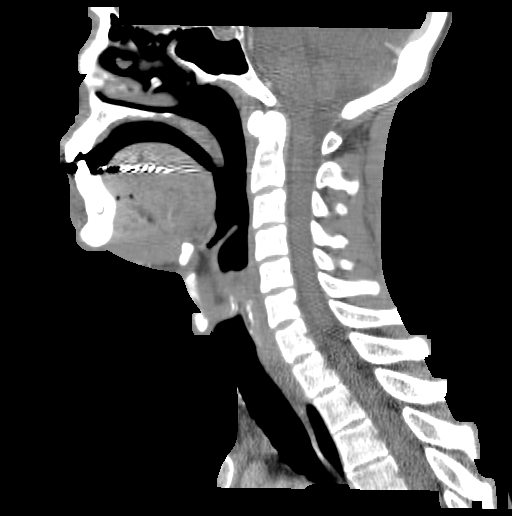
[im 38/75  bone]
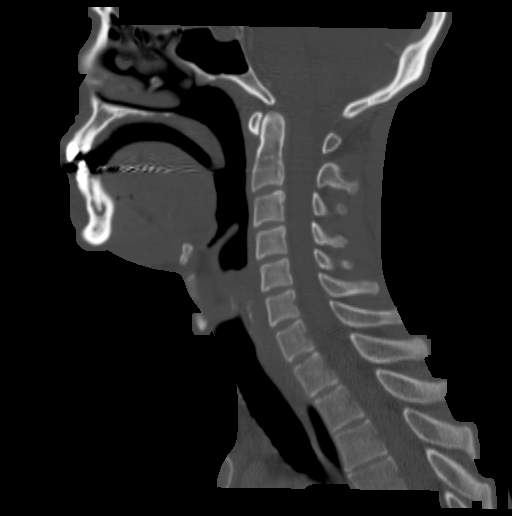
[im 44/75  bone]
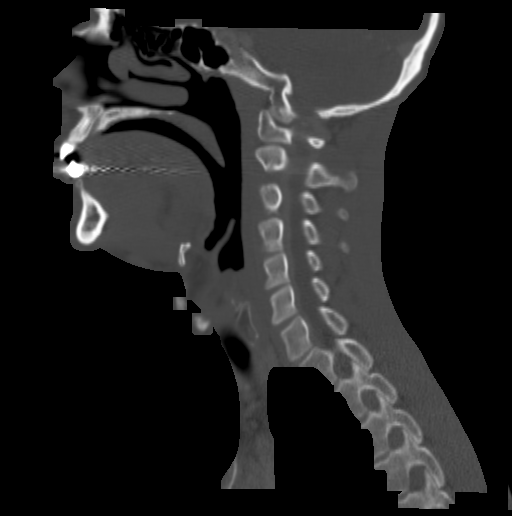
[im 50/75  bone]
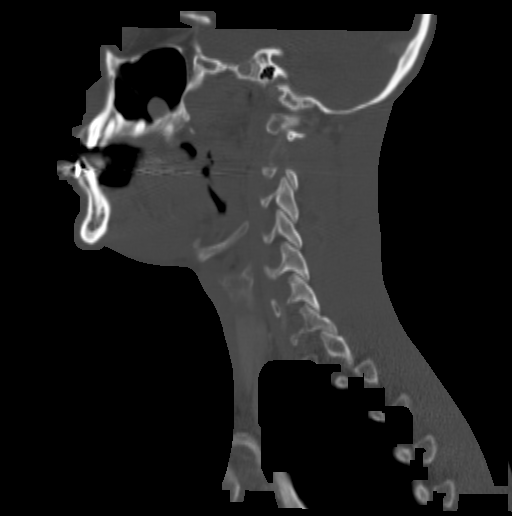

[Series 7: neck 2.0 orth axial to hyoid · axial · 0.42mm/px · z∈[+972,+1155]mm · 5 of 146 slices shown, 7 images]
[im 25/146  soft-tissue]
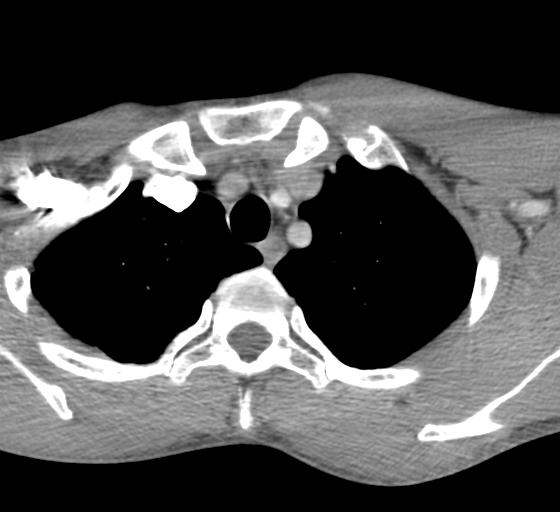
[im 25/146  bone]
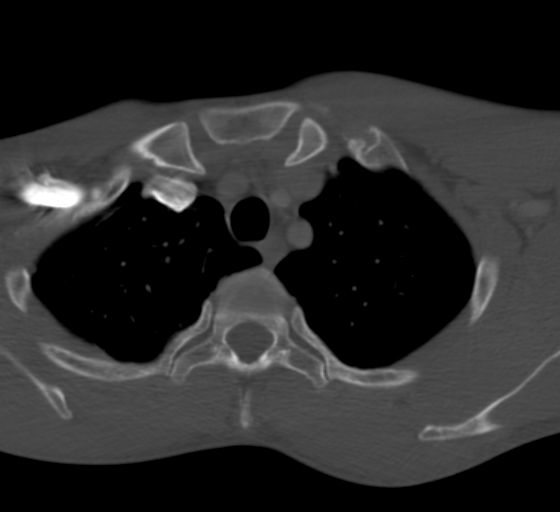
[im 49/146  bone]
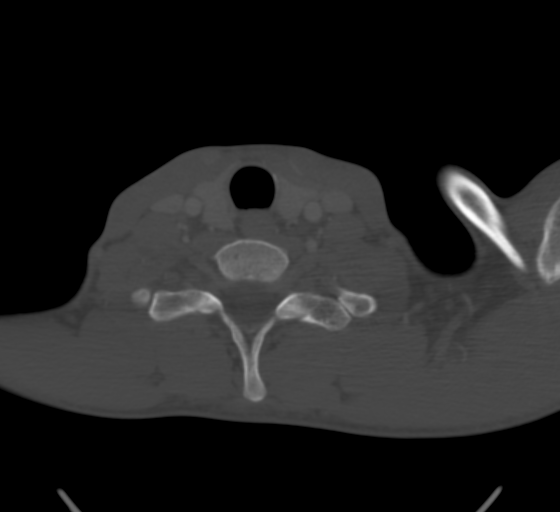
[im 73/146  bone]
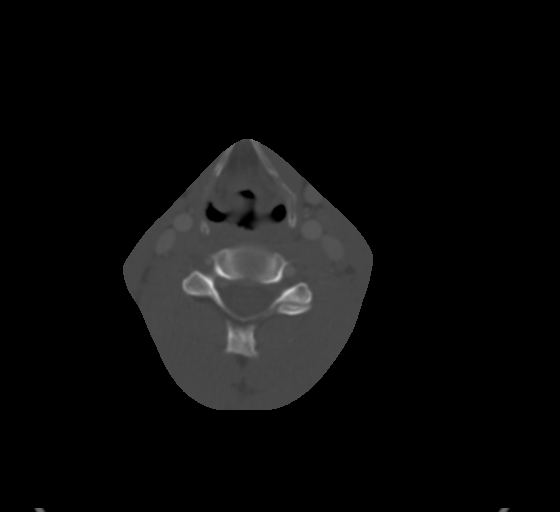
[im 97/146  bone]
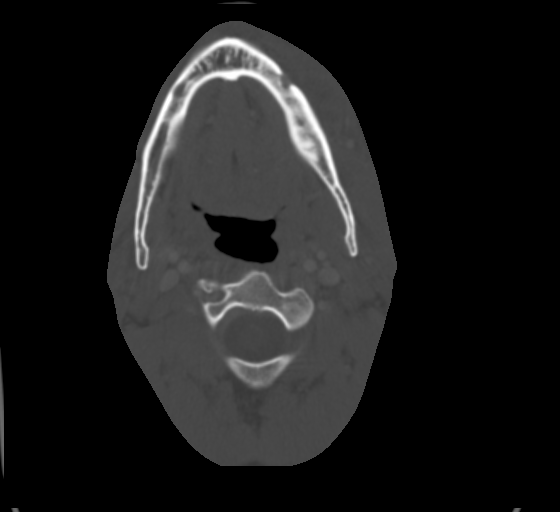
[im 121/146  soft-tissue]
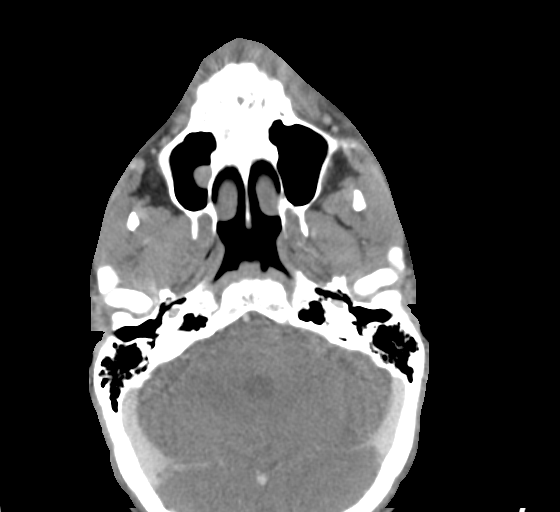
[im 121/146  bone]
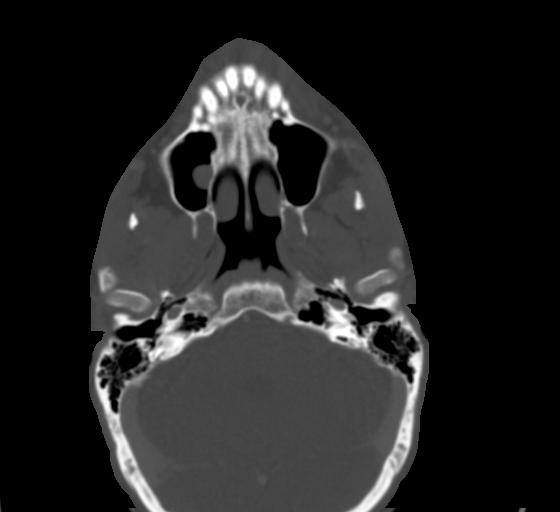

[15 of 33 positions shown; findings below may reference images not displayed]

FINDINGS: Visualized portions of the brain demonstrate a normal appearance.
Visualized globes are unremarkable.

Small retention cyst noted within the right maxillary sinus.
Paranasal sinuses are otherwise clear. No mastoid effusion. Middle
ear cavities clear.

Salivary glands including the parotid glands and submandibular
glands are within normal limits.

There is diffuse inflammatory stranding with soft tissue swelling
throughout the left face, involving the masticator space extending
into the submental and sub lingual region on the left. Findings
consistent with cellulitis. Dental caries present within the left
first mandibular molar. Adjacent to this, there is a large rim
enhancing hypodense collection measuring approximately 1.5 x 3.7 x
3.0 cm at the lateral non shin of the left mandibular body,
compatible with an odontogenic abscess (series 2, image 51). No
other ab cyst seen within the oral cavity.

Palatine tonsils within normal limits. Parapharyngeal fat preserved.
Nasopharynx within normal limits. No retropharyngeal collection or
abscess. Hypopharynx and supraglottic larynx within normal limits.
Epiglottis normal. Vallecula is clear. True vocal cords symmetric.
Subglottic airway is clear.

Thyroid gland unremarkable.

Multiple enlarged left level 1B nodes measure up to 10 mm in short
axis are present, likely reactive. Level 1A nodes measure up the 13
mm.

There is a 3 mm nodule within the right upper lobe, indeterminate.
Visualized lungs are otherwise clear.

Normal intravascular enhancement seen within the neck.

No acute osseous abnormality. No worrisome lytic or blastic osseous
lesion.
IMPRESSION: 1. 1.5 x 3.7 x 3.0 cm odontogenic abscess adjacent to the first left
mandibular molar as above. Associated inflammatory stranding and
swelling within the left face is consistent with associated
cellulitis.
2. Enlarged submental adenopathy, likely reactive in nature.
3. 3 mm right upper lobe pulmonary nodule, indeterminate. Please
note that Fleischner criteria do not apply in patients of this age.

## 2020-09-17 DEATH — deceased
